# Patient Record
Sex: Male | Born: 1945
Health system: Southern US, Community
[De-identification: ages and names within clinical notes are randomized; demographics above are authoritative.]

## PROBLEM LIST (undated history)

## (undated) DIAGNOSIS — J189 Pneumonia, unspecified organism: Secondary | ICD-10-CM

## (undated) DIAGNOSIS — G4730 Sleep apnea, unspecified: Secondary | ICD-10-CM

## (undated) DIAGNOSIS — E119 Type 2 diabetes mellitus without complications: Secondary | ICD-10-CM

## (undated) HISTORY — PX: TONSILLECTOMY: SUR1361

## (undated) HISTORY — PX: COLONOSCOPY: SHX174

## (undated) HISTORY — DX: Sleep apnea, unspecified: G47.30

## (undated) HISTORY — DX: Type 2 diabetes mellitus without complications: E11.9

## (undated) HISTORY — DX: Pneumonia, unspecified organism: J18.9

---

## 2006-07-01 ENCOUNTER — Ambulatory Visit (HOSPITAL_COMMUNITY): Admission: RE | Admit: 2006-07-01 | Discharge: 2006-07-01 | Payer: Self-pay | Admitting: Gastroenterology

## 2009-12-30 ENCOUNTER — Ambulatory Visit (HOSPITAL_BASED_OUTPATIENT_CLINIC_OR_DEPARTMENT_OTHER): Admission: RE | Admit: 2009-12-30 | Discharge: 2009-12-30 | Payer: Self-pay | Admitting: Internal Medicine

## 2010-01-07 ENCOUNTER — Ambulatory Visit: Payer: Self-pay | Admitting: Internal Medicine

## 2010-02-09 ENCOUNTER — Ambulatory Visit (HOSPITAL_BASED_OUTPATIENT_CLINIC_OR_DEPARTMENT_OTHER): Admission: RE | Admit: 2010-02-09 | Discharge: 2010-02-09 | Payer: Self-pay | Admitting: Internal Medicine

## 2010-02-11 ENCOUNTER — Ambulatory Visit: Payer: Self-pay | Admitting: Internal Medicine

## 2010-08-03 ENCOUNTER — Ambulatory Visit: Payer: Self-pay | Admitting: Family Medicine

## 2010-08-03 DIAGNOSIS — E119 Type 2 diabetes mellitus without complications: Secondary | ICD-10-CM

## 2010-08-03 DIAGNOSIS — G4730 Sleep apnea, unspecified: Secondary | ICD-10-CM | POA: Insufficient documentation

## 2010-08-03 DIAGNOSIS — I10 Essential (primary) hypertension: Secondary | ICD-10-CM | POA: Insufficient documentation

## 2010-11-21 NOTE — Assessment & Plan Note (Signed)
Summary: new pt/spouse of hosp emp/Link to Wellness visit/bmc   Vital Signs:  Patient profile:   65 year old male Height:      72 inches Weight:      200.3 pounds BMI:     27.26  Vitals Entered By: Wyona Almas PHD (August 03, 2010 8:58 AM)  History of Present Illness: Assessment:  Spent 60 minutes w/ pt.  Usual eating pattern includes 3 meals & evening snack.  Robert Fletcher eats [employer] 8-9 X wk (including lunch 6 X wk).  Everyday foods/beverages include Atkins shake w/ 2 tbsp flax seed, diet green tea, iced tea w/ Swt 'n Low, raw almonds.  Current exercise routine includes 60 min TM & other cardio ex 3 X wk.  No resistance ex due to shoulder pblms.  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  Can't estimate kcal intake from 24-hr recall b/c pt couldn't remember dinner: B (7 AM)- Atkins choc bar (190 kcal), water; Snk (9 & 11AM)- 12 oz low-Na V-8, diet green tea; L (12:30 PM)- chef salad w/ chs & oil & vin, S&P, no croutons/crackers,  40 oz unswt tea; Snk (4 & 6 PM)- diet lemonade, diet green tea; D (8 PM)- ???; Snk (9 PM)- apple; 8-10 raw almonds, sugar-free almonds.  Med's include Actos (will go to Metformin if BG don't improve), Quinapril, simvastatin, C-PAP machine, 2000 IU vit D, MVM, cinnamon extract, Alpha lipoic acid, fish oil, B12.  Personal goal is to get off all med's.   Usual bkfst is Atkins shake w/ 2 tbsp flax seed.  Robert Fletcher seldom checks BG, but does occasionally pre-lunch (107-124) and 2-hr pp (109-118).  Last A1c was  ~a month ago:  5.9.  Robert Fletcher relates difficulty complying w/ many recommenda's to his work, i.e., mtg w/ clients gets in the way of checking BG & requires lunchs out; long work hours leave litle time for exercise.  Although Robert Fletcher is on Simvastatin, he says it is not for hyperlipidemia, but for his family hx of CVD, and the quinapril he said is for pre-HTN.  Vit D never tested, but physician recommended supplementation.   Nutrition Diagnosis:   Physical inactivity (NB-2.1) related to time constraints as evidenced by exercise 3 X wk.  Excessive mineral intake (sodium) (NI-55.2) related to restauarnt foods as evidenced by 8-9 rest meals/wk.   Intervention: See Patient Instructions.    Monitoring/Eval:  Per pt; provided my phone # & email for Qs.      Other Orders: Inital Assessment Each - [facility] [phone])  Patient Instructions: 1)  No more than 5 hours between eating.  Afternoon snack:  String cheese, yogurt, fresh fruit, protein bar.   2)  Emphasis on olive oil and canola oil, including olive oil-based mayo, & fish (at least once a week),  3)  Obtain twice as many veg's as protein or carbohydrate foods for both lunch and dinner.   4)  Call or email Dr. Gerilyn Pilgrim with your lipid profile.  Include any info on particle size.  Also let me know info re. cinnamon supplement.   5)  Red flags for poor food decisions:  Hungry, Angry, Anxious, Lonely, Tired (HAALT).   6)  Look at fat and calories in restaurant foods, & try to choose accordingly.    7)  Consider how you can incorporate more activity to your day.   8)  Keep me posted on your progress: [phone] or [email or web address].

## 2011-03-09 NOTE — Op Note (Signed)
NAMEKLEBER, Robert Fletcher                   ACCOUNT NO.:  [id number]   MEDICAL RECORD NO.:  [id number]          PATIENT TYPE:  AMB   LOCATION:  ENDO                         FACILITY:  [facility]   PHYSICIAN:  Bernette Redbird, M.D.   DATE OF BIRTH:  14-Oct-1946   DATE OF PROCEDURE:  07/01/2006  DATE OF DISCHARGE:                                 OPERATIVE REPORT   PROCEDURE:  Colonoscopy.   INDICATION:  65 year old patient with no worrisome risk factors or symptoms  pertaining to the lower tract, for initial colon cancer screening  colonoscopy.   FINDINGS:  Normal exam to the terminal ileum.   PROCEDURE:  The nature, purpose and risks of the procedure have been  discussed with the patient through our open access program through the  office.  I reviewed the purpose and rationale and risks of the procedure  with the patient immediately prior to the exam.  He had provided written  consent.   SEDATION:  Fentanyl 75 mcg and Versed 6 mg IV without arrhythmias or  desaturation.   Digital exam of the prostate was normal.  The Olympus adjustable tension  pediatric video colonoscope was readily advanced to the terminal ileum which  had normal appearance and pullback was then performed in a gradual fashion  around the colon.  The quality of prep was excellent and it is felt that all  areas were well seen.   This was a completely normal examination.  No polyps, cancer, colitis,  vascular malformations or diverticulosis were noted and retroflexion of the  rectum was normal.  No biopsies were obtained.  The patient tolerated the  procedure well and there no apparent complications.   IMPRESSION:  Normal screening colonoscopy in a standard risk individual.   RECOMMENDATIONS:  Repeat colonoscopy in 10 years per current recommendations  of the American cancer Society.           ______________________________  Bernette Redbird, M.D.     RB/MEDQ  D:  07/01/2006  T:  07/01/2006  Job:  [id number]   cc:    Al Decant. Janey Greaser, MD

## 2015-12-06 DIAGNOSIS — Z7984 Long term (current) use of oral hypoglycemic drugs: Secondary | ICD-10-CM | POA: Diagnosis not present

## 2015-12-06 DIAGNOSIS — R972 Elevated prostate specific antigen [PSA]: Secondary | ICD-10-CM | POA: Diagnosis not present

## 2015-12-06 DIAGNOSIS — E119 Type 2 diabetes mellitus without complications: Secondary | ICD-10-CM | POA: Diagnosis not present

## 2015-12-07 MED FILL — TRUEplus LANCETS 30G MISC: 90 days supply | Qty: 100 | Fill #0

## 2015-12-07 MED FILL — metFORMIN HCL 500 MG TABS: 500 | 90 days supply | Qty: 180 | Fill #2

## 2015-12-07 MED FILL — TRUE METRIX GLUCOSE TEST ST: 90 days supply | Qty: 100 | Fill #0

## 2015-12-12 DIAGNOSIS — H2513 Age-related nuclear cataract, bilateral: Secondary | ICD-10-CM | POA: Diagnosis not present

## 2015-12-12 DIAGNOSIS — H43813 Vitreous degeneration, bilateral: Secondary | ICD-10-CM | POA: Diagnosis not present

## 2015-12-12 DIAGNOSIS — E119 Type 2 diabetes mellitus without complications: Secondary | ICD-10-CM | POA: Diagnosis not present

## 2016-01-04 MED FILL — TAMSULOSIN HCL 0.4 MG CAP: 0.4 | 30 days supply | Qty: 30 | Fill #1

## 2016-01-05 MED FILL — CIALIS 20 MG TABLET: 20 | 30 days supply | Qty: 6 | Fill #1

## 2016-01-21 DIAGNOSIS — J111 Influenza due to unidentified influenza virus with other respiratory manifestations: Secondary | ICD-10-CM | POA: Diagnosis not present

## 2016-01-24 DIAGNOSIS — J111 Influenza due to unidentified influenza virus with other respiratory manifestations: Secondary | ICD-10-CM | POA: Diagnosis not present

## 2016-03-01 MED FILL — TAMSULOSIN HCL 0.4 MG CAP: 0.4 | 30 days supply | Qty: 30 | Fill #2

## 2016-03-22 MED FILL — metFORMIN HCL 500 MG TABS: 500 | 90 days supply | Qty: 180 | Fill #3

## 2016-03-26 MED FILL — CIALIS 20 MG TABLET: 20 | 30 days supply | Qty: 6 | Fill #2

## 2016-04-10 MED FILL — TAMSULOSIN HCL 0.4 MG CAP: 0.4 | 30 days supply | Qty: 30 | Fill #3

## 2016-05-17 MED FILL — TAMSULOSIN HCL 0.4 MG CAP: 0.4 | 30 days supply | Qty: 30 | Fill #4

## 2016-06-07 DIAGNOSIS — E119 Type 2 diabetes mellitus without complications: Secondary | ICD-10-CM | POA: Diagnosis not present

## 2016-06-07 DIAGNOSIS — Z7984 Long term (current) use of oral hypoglycemic drugs: Secondary | ICD-10-CM | POA: Diagnosis not present

## 2016-06-11 MED FILL — CIALIS 20 MG TABLET: 20 | 30 days supply | Qty: 6 | Fill #3

## 2016-06-26 MED FILL — TAMSULOSIN HCL 0.4 MG CAP: 0.4 | 30 days supply | Qty: 30 | Fill #5

## 2016-07-19 DIAGNOSIS — Z23 Encounter for immunization: Secondary | ICD-10-CM | POA: Diagnosis not present

## 2016-07-27 MED FILL — TAMSULOSIN HCL 0.4 MG CAP: 0.4 | 90 days supply | Qty: 90 | Fill #6

## 2016-07-27 MED FILL — CIALIS 20 MG TABLET: 20 | 30 days supply | Qty: 6 | Fill #4

## 2016-09-04 MED FILL — metFORMIN HCL 500 MG TABS: 500 | 90 days supply | Qty: 90 | Fill #0

## 2016-11-22 MED FILL — SILDENAFIL 100 MG TABLET: 100 | 30 days supply | Qty: 6 | Fill #0

## 2016-11-22 MED FILL — metFORMIN HCL 500 MG TABS: 500 | 90 days supply | Qty: 90 | Fill #1

## 2016-11-22 MED FILL — TAMSULOSIN HCL 0.4 MG CAP: 0.4 | 30 days supply | Qty: 30 | Fill #0

## 2016-12-11 DIAGNOSIS — N400 Enlarged prostate without lower urinary tract symptoms: Secondary | ICD-10-CM | POA: Diagnosis not present

## 2016-12-11 DIAGNOSIS — E119 Type 2 diabetes mellitus without complications: Secondary | ICD-10-CM | POA: Diagnosis not present

## 2016-12-12 DIAGNOSIS — H5203 Hypermetropia, bilateral: Secondary | ICD-10-CM | POA: Diagnosis not present

## 2016-12-12 DIAGNOSIS — H524 Presbyopia: Secondary | ICD-10-CM | POA: Diagnosis not present

## 2017-01-03 MED FILL — TAMSULOSIN HCL 0.4 MG CAP: 0.4 | 90 days supply | Qty: 90 | Fill #1

## 2017-02-19 MED FILL — metFORMIN HCL 500 MG TABS: 500 | 90 days supply | Qty: 90 | Fill #2

## 2017-04-30 MED FILL — TAMSULOSIN HCL 0.4 MG CAP: 0.4 | 90 days supply | Qty: 90 | Fill #2

## 2017-05-14 MED FILL — SILDENAFIL 100 MG TABLET: 100 | 30 days supply | Qty: 6 | Fill #1

## 2017-06-10 DIAGNOSIS — E1165 Type 2 diabetes mellitus with hyperglycemia: Secondary | ICD-10-CM | POA: Diagnosis not present

## 2017-06-10 DIAGNOSIS — E119 Type 2 diabetes mellitus without complications: Secondary | ICD-10-CM | POA: Diagnosis not present

## 2017-06-10 DIAGNOSIS — N400 Enlarged prostate without lower urinary tract symptoms: Secondary | ICD-10-CM | POA: Diagnosis not present

## 2017-06-10 DIAGNOSIS — Z125 Encounter for screening for malignant neoplasm of prostate: Secondary | ICD-10-CM | POA: Diagnosis not present

## 2017-06-10 DIAGNOSIS — N529 Male erectile dysfunction, unspecified: Secondary | ICD-10-CM | POA: Diagnosis not present

## 2017-06-10 DIAGNOSIS — Z0000 Encounter for general adult medical examination without abnormal findings: Secondary | ICD-10-CM | POA: Diagnosis not present

## 2017-06-10 DIAGNOSIS — G4733 Obstructive sleep apnea (adult) (pediatric): Secondary | ICD-10-CM | POA: Diagnosis not present

## 2017-06-10 DIAGNOSIS — N183 Chronic kidney disease, stage 3 (moderate): Secondary | ICD-10-CM | POA: Diagnosis not present

## 2017-06-10 DIAGNOSIS — Z7984 Long term (current) use of oral hypoglycemic drugs: Secondary | ICD-10-CM | POA: Diagnosis not present

## 2017-06-19 MED FILL — metFORMIN HCL 500 MG TABS: 500 | 90 days supply | Qty: 90 | Fill #3

## 2017-07-04 DIAGNOSIS — M9902 Segmental and somatic dysfunction of thoracic region: Secondary | ICD-10-CM | POA: Diagnosis not present

## 2017-07-04 DIAGNOSIS — M5431 Sciatica, right side: Secondary | ICD-10-CM | POA: Diagnosis not present

## 2017-07-04 DIAGNOSIS — M9903 Segmental and somatic dysfunction of lumbar region: Secondary | ICD-10-CM | POA: Diagnosis not present

## 2017-07-04 DIAGNOSIS — M9905 Segmental and somatic dysfunction of pelvic region: Secondary | ICD-10-CM | POA: Diagnosis not present

## 2017-07-11 DIAGNOSIS — M5431 Sciatica, right side: Secondary | ICD-10-CM | POA: Diagnosis not present

## 2017-07-11 DIAGNOSIS — M9903 Segmental and somatic dysfunction of lumbar region: Secondary | ICD-10-CM | POA: Diagnosis not present

## 2017-07-11 DIAGNOSIS — M9905 Segmental and somatic dysfunction of pelvic region: Secondary | ICD-10-CM | POA: Diagnosis not present

## 2017-07-11 DIAGNOSIS — M9902 Segmental and somatic dysfunction of thoracic region: Secondary | ICD-10-CM | POA: Diagnosis not present

## 2017-07-19 DIAGNOSIS — M9902 Segmental and somatic dysfunction of thoracic region: Secondary | ICD-10-CM | POA: Diagnosis not present

## 2017-07-19 DIAGNOSIS — M9903 Segmental and somatic dysfunction of lumbar region: Secondary | ICD-10-CM | POA: Diagnosis not present

## 2017-07-19 DIAGNOSIS — M9905 Segmental and somatic dysfunction of pelvic region: Secondary | ICD-10-CM | POA: Diagnosis not present

## 2017-07-19 DIAGNOSIS — M5431 Sciatica, right side: Secondary | ICD-10-CM | POA: Diagnosis not present

## 2017-07-24 ENCOUNTER — Encounter: Payer: Self-pay | Admitting: Gastroenterology

## 2017-07-25 DIAGNOSIS — M9905 Segmental and somatic dysfunction of pelvic region: Secondary | ICD-10-CM | POA: Diagnosis not present

## 2017-07-25 DIAGNOSIS — M5431 Sciatica, right side: Secondary | ICD-10-CM | POA: Diagnosis not present

## 2017-07-25 DIAGNOSIS — M9903 Segmental and somatic dysfunction of lumbar region: Secondary | ICD-10-CM | POA: Diagnosis not present

## 2017-07-25 DIAGNOSIS — M9902 Segmental and somatic dysfunction of thoracic region: Secondary | ICD-10-CM | POA: Diagnosis not present

## 2017-08-08 DIAGNOSIS — M9905 Segmental and somatic dysfunction of pelvic region: Secondary | ICD-10-CM | POA: Diagnosis not present

## 2017-08-08 DIAGNOSIS — M5431 Sciatica, right side: Secondary | ICD-10-CM | POA: Diagnosis not present

## 2017-08-08 DIAGNOSIS — M9903 Segmental and somatic dysfunction of lumbar region: Secondary | ICD-10-CM | POA: Diagnosis not present

## 2017-08-08 DIAGNOSIS — M9902 Segmental and somatic dysfunction of thoracic region: Secondary | ICD-10-CM | POA: Diagnosis not present

## 2017-08-12 MED FILL — TAMSULOSIN HCL 0.4 MG CAP: 0.4 | 90 days supply | Qty: 90 | Fill #3

## 2017-09-09 DIAGNOSIS — R5383 Other fatigue: Secondary | ICD-10-CM | POA: Diagnosis not present

## 2017-09-09 DIAGNOSIS — R51 Headache: Secondary | ICD-10-CM | POA: Diagnosis not present

## 2017-09-09 DIAGNOSIS — J069 Acute upper respiratory infection, unspecified: Secondary | ICD-10-CM | POA: Diagnosis not present

## 2017-09-09 DIAGNOSIS — R05 Cough: Secondary | ICD-10-CM | POA: Diagnosis not present

## 2017-09-09 DIAGNOSIS — B9789 Other viral agents as the cause of diseases classified elsewhere: Secondary | ICD-10-CM | POA: Diagnosis not present

## 2017-09-16 ENCOUNTER — Other Ambulatory Visit: Payer: Self-pay | Admitting: Internal Medicine

## 2017-09-16 ENCOUNTER — Ambulatory Visit
Admission: RE | Admit: 2017-09-16 | Discharge: 2017-09-16 | Disposition: A | Discharge status: Home / Self Care | Payer: 59 | Source: Ambulatory Visit | Attending: Internal Medicine | Admitting: Internal Medicine

## 2017-09-16 DIAGNOSIS — R059 Cough, unspecified: Secondary | ICD-10-CM

## 2017-09-16 DIAGNOSIS — R05 Cough: Secondary | ICD-10-CM | POA: Diagnosis not present

## 2017-09-16 DIAGNOSIS — R918 Other nonspecific abnormal finding of lung field: Secondary | ICD-10-CM | POA: Diagnosis not present

## 2017-09-16 MED FILL — AZITHROMYCIN 250 MG TABLET: 250 | 5 days supply | Qty: 6 | Fill #0

## 2017-09-20 MED FILL — metFORMIN HCL 500 MG TABS: 500 | 90 days supply | Qty: 90 | Fill #0

## 2017-09-23 ENCOUNTER — Other Ambulatory Visit: Payer: Self-pay

## 2017-09-23 ENCOUNTER — Ambulatory Visit (AMBULATORY_SURGERY_CENTER): Payer: Self-pay | Admitting: *Deleted

## 2017-09-23 VITALS — Ht 72.0 in | Wt 192.0 lb

## 2017-09-23 DIAGNOSIS — Z1211 Encounter for screening for malignant neoplasm of colon: Secondary | ICD-10-CM

## 2017-09-23 MED ORDER — NA SULFATE-K SULFATE-MG SULF 17.5-3.13-1.6 GM/177ML PO SOLN: ORAL | 0 refills | Status: DC

## 2017-09-23 MED FILL — SUPREP BOWEL PREP KIT: 17.5-3.13-1 | 1 days supply | Qty: 354 | Fill #0

## 2017-09-23 NOTE — Progress Notes (Signed)
Patient denies any allergies to eggs or soy. Patient denies any problems with anesthesia/sedation. Patient denies any oxygen use at home. Patient denies taking any diet/weight loss medications or blood thinners. EMMI education assisgned to patient on colonoscopy, this was explained and instructions given to patient. Patient was just treated for "walking pneumonia", he is aware to contact his PCP to get repeat chest xray and clearance before screening colonoscopy. Pt will call us back with this information.

## 2017-09-25 ENCOUNTER — Encounter: Payer: Self-pay | Admitting: Gastroenterology

## 2017-09-25 DIAGNOSIS — H2513 Age-related nuclear cataract, bilateral: Secondary | ICD-10-CM | POA: Diagnosis not present

## 2017-09-25 DIAGNOSIS — H2511 Age-related nuclear cataract, right eye: Secondary | ICD-10-CM | POA: Diagnosis not present

## 2017-09-27 ENCOUNTER — Telehealth: Payer: Self-pay | Admitting: *Deleted

## 2017-09-27 NOTE — Telephone Encounter (Signed)
Called patient, no answer, left message. Informed patient to call us back to let us know if he seen PCP and was cleared for procedure after recent pneumonia, before up coming colonoscopy on 10/07/17.

## 2017-10-01 ENCOUNTER — Ambulatory Visit
Admission: RE | Admit: 2017-10-01 | Discharge: 2017-10-01 | Disposition: A | Discharge status: Home / Self Care | Payer: 59 | Source: Ambulatory Visit | Attending: Internal Medicine | Admitting: Internal Medicine

## 2017-10-01 ENCOUNTER — Other Ambulatory Visit: Payer: Self-pay | Admitting: Internal Medicine

## 2017-10-01 DIAGNOSIS — J180 Bronchopneumonia, unspecified organism: Secondary | ICD-10-CM

## 2017-10-01 DIAGNOSIS — R918 Other nonspecific abnormal finding of lung field: Secondary | ICD-10-CM | POA: Diagnosis not present

## 2017-10-04 NOTE — Telephone Encounter (Signed)
Spoke with Dr.John Griffin's nurse, "Alona BeneJoyce" she went and asked Dr.Griffin if patient was cleared for colonoscopy from having the pneumonia and Dr.Griffin said yes the patient is cleared for colonoscopy. I gave Bonita QuinLinda patient's wife this information.

## 2017-10-07 ENCOUNTER — Encounter: Payer: Self-pay | Admitting: Gastroenterology

## 2017-10-07 ENCOUNTER — Other Ambulatory Visit: Payer: Self-pay

## 2017-10-07 ENCOUNTER — Ambulatory Visit (AMBULATORY_SURGERY_CENTER): Payer: 59 | Admitting: Gastroenterology

## 2017-10-07 VITALS — BP 104/50 | HR 56 | Temp 97.3°F | Resp 12 | Ht 72.0 in | Wt 192.0 lb

## 2017-10-07 DIAGNOSIS — Z1212 Encounter for screening for malignant neoplasm of rectum: Secondary | ICD-10-CM

## 2017-10-07 DIAGNOSIS — G4733 Obstructive sleep apnea (adult) (pediatric): Secondary | ICD-10-CM | POA: Diagnosis not present

## 2017-10-07 DIAGNOSIS — Z1211 Encounter for screening for malignant neoplasm of colon: Secondary | ICD-10-CM | POA: Diagnosis present

## 2017-10-07 DIAGNOSIS — E119 Type 2 diabetes mellitus without complications: Secondary | ICD-10-CM | POA: Diagnosis not present

## 2017-10-07 MED ORDER — SODIUM CHLORIDE 0.9 % IV SOLN: 500.0000 mL | Freq: Once | INTRAVENOUS | Status: AC

## 2017-10-07 NOTE — Op Note (Signed)
[facility] Patient Name: Robert KennedyCarl Fletcher Procedure Date: 10/07/2017 9:04 AM MRN: [id number] Endoscopist: Sherilyn CooterHenry L. Myrtie Neitheranis , MD Age: 71 Referring MD:  Date of Birth: 11/14/1945 Gender: Male Account #: [id number] Procedure:                Colonoscopy Indications:              Screening for colorectal malignant neoplasm                            (reportedly normal screening colonoscopy 10 years                            prior) Medicines:                Monitored Anesthesia Care Procedure:                Pre-Anesthesia Assessment:                           - Prior to the procedure, a History and Physical                            was performed, and patient medications and                            allergies were reviewed. The patient's tolerance of                            previous anesthesia was also reviewed. The risks                            and benefits of the procedure and the sedation                            options and risks were discussed with the patient.                            All questions were answered, and informed consent                            was obtained. Prior Anticoagulants: The patient has                            taken no previous anticoagulant or antiplatelet                            agents. ASA Grade Assessment: II - A patient with                            mild systemic disease. After reviewing the risks                            and benefits, the patient was deemed in  satisfactory condition to undergo the procedure.                           After obtaining informed consent, the colonoscope                            was passed under direct vision. Throughout the                            procedure, the patient's blood pressure, pulse, and                            oxygen saturations were monitored continuously. The                            Colonoscope was introduced through the anus and             advanced to the the cecum, identified by                            appendiceal orifice and ileocecal valve. The                            colonoscopy was performed without difficulty. The                            patient tolerated the procedure well. The quality                            of the bowel preparation was excellent. The                            ileocecal valve, appendiceal orifice, and rectum                            were photographed. The quality of the bowel                            preparation was evaluated using the BBPS [facility]                            Bowel Preparation Scale) with scores of: Right                            Colon = 3, Transverse Colon = 3 and Left Colon = 3                            (entire mucosa seen well with no residual staining,                            small fragments of stool or opaque liquid). The                            total BBPS score  equals 9. The bowel preparation                            used was SUPREP. Scope In: 9:08:32 AM Scope Out: 9:21:41 AM Scope Withdrawal Time: 0 hours 9 minutes 42 seconds  Total Procedure Duration: 0 hours 13 minutes 9 seconds  Findings:                 The perianal and digital rectal examinations were                            normal.                           The entire examined colon appeared normal on direct                            and retroflexion views. Complications:            No immediate complications. Estimated Blood Loss:     Estimated blood loss: none. Impression:               - The entire examined colon is normal on direct and                            retroflexion views.                           - No specimens collected. Recommendation:           - Patient has a contact number available for                            emergencies. The signs and symptoms of potential                            delayed complications were discussed with the                             patient. Return to normal activities tomorrow.                            Written discharge instructions were provided to the                            patient.                           - Resume previous diet.                           - Continue present medications.                           - Repeat colonoscopy in 10 years for screening                            purposes. Henry L. Myrtie Neither, MD 10/07/2017 9:26:34 AM  This report has been signed electronically.

## 2017-10-07 NOTE — Progress Notes (Signed)
Pt's states no medical or surgical changes since previsit or office visit. 

## 2017-10-07 NOTE — Patient Instructions (Signed)
YOU HAD AN ENDOSCOPIC PROCEDURE TODAY AT THE [facility]:   Refer to the procedure report that was given to you for any specific questions about what was found during the examination.  If the procedure report does not answer your questions, please call your gastroenterologist to clarify.  If you requested that your care partner not be given the details of your procedure findings, then the procedure report has been included in a sealed envelope for you to review at your convenience later.  YOU SHOULD EXPECT: Some feelings of bloating in the abdomen. Passage of more gas than usual.  Walking can help get rid of the air that was put into your GI tract during the procedure and reduce the bloating. If you had a lower endoscopy (such as a colonoscopy or flexible sigmoidoscopy) you may notice spotting of blood in your stool or on the toilet paper. If you underwent a bowel prep for your procedure, you may not have a normal bowel movement for a few days.  Please Note:  You might notice some irritation and congestion in your nose or some drainage.  This is from the oxygen used during your procedure.  There is no need for concern and it should clear up in a day or so.  SYMPTOMS TO REPORT IMMEDIATELY:   Following lower endoscopy (colonoscopy or flexible sigmoidoscopy):  Excessive amounts of blood in the stool  Significant tenderness or worsening of abdominal pains  Swelling of the abdomen that is new, acute  Fever of 100F or higher  For urgent or emergent issues, a gastroenterologist can be reached at any hour by calling [phone].   DIET:  We do recommend a small meal at first, but then you may proceed to your regular diet.  Drink plenty of fluids but you should avoid alcoholic beverages for 24 hours.  ACTIVITY:  You should plan to take it easy for the rest of today and you should NOT DRIVE or use heavy machinery until tomorrow (because of the sedation medicines used during the test).     FOLLOW UP: Our staff will call the number listed on your records the next business day following your procedure to check on you and address any questions or concerns that you may have regarding the information given to you following your procedure. If we do not reach you, we will leave a message.  However, if you are feeling well and you are not experiencing any problems, there is no need to return our call.  We will assume that you have returned to your regular daily activities without incident.  If any biopsies were taken you will be contacted by phone or by letter within the next 1-3 weeks.  Please call us at [phone] if you have not heard about the biopsies in 3 weeks.    SIGNATURES/CONFIDENTIALITY: You and/or your care partner have signed paperwork which will be entered into your electronic medical record.  These signatures attest to the fact that that the information above on your After Visit Summary has been reviewed and is understood.  Full responsibility of the confidentiality of this discharge information lies with you and/or your care-partner.    Thank you for choosing us to take care of your healthcare needs today.

## 2017-10-07 NOTE — Progress Notes (Signed)
Report given to PACU, vss 

## 2017-10-08 ENCOUNTER — Telehealth: Payer: Self-pay | Admitting: *Deleted

## 2017-10-08 NOTE — Telephone Encounter (Signed)
  Follow up Call-  Call back number 10/07/2017  Post procedure Call Back phone  # [phone]  Permission to leave phone message Yes  Some recent data might be hidden     Patient questions:  Do you have a fever, pain , or abdominal swelling? No. Pain Score  0 *  Have you tolerated food without any problems? Yes.    Have you been able to return to your normal activities? Yes.    Do you have any questions about your discharge instructions: Diet   No. Medications  No. Follow up visit  No.  Do you have questions or concerns about your Care? No.  Actions: * If pain score is 4 or above: No action needed, pain <4.

## 2017-10-23 MED FILL — BESIVANCE 0.6% SUSP: 0.6 | 30 days supply | Qty: 5 | Fill #0

## 2017-10-30 MED FILL — DUREZOL 0.05% EYE DROPS: 0.05 | 30 days supply | Qty: 5 | Fill #0

## 2017-10-31 MED FILL — PROLENSA 0.07% EYE DROPS: 0.07 | 60 days supply | Qty: 3 | Fill #0

## 2017-11-05 DIAGNOSIS — H2511 Age-related nuclear cataract, right eye: Secondary | ICD-10-CM | POA: Diagnosis not present

## 2017-11-05 DIAGNOSIS — H25811 Combined forms of age-related cataract, right eye: Secondary | ICD-10-CM | POA: Diagnosis not present

## 2017-11-06 DIAGNOSIS — H2512 Age-related nuclear cataract, left eye: Secondary | ICD-10-CM | POA: Diagnosis not present

## 2017-11-19 DIAGNOSIS — H25812 Combined forms of age-related cataract, left eye: Secondary | ICD-10-CM | POA: Diagnosis not present

## 2017-11-19 DIAGNOSIS — H2512 Age-related nuclear cataract, left eye: Secondary | ICD-10-CM | POA: Diagnosis not present

## 2017-11-21 MED FILL — TAMSULOSIN HCL 0.4 MG CAP: 0.4 | 30 days supply | Qty: 30 | Fill #4

## 2017-12-16 DIAGNOSIS — E119 Type 2 diabetes mellitus without complications: Secondary | ICD-10-CM | POA: Diagnosis not present

## 2017-12-16 DIAGNOSIS — N183 Chronic kidney disease, stage 3 (moderate): Secondary | ICD-10-CM | POA: Diagnosis not present

## 2017-12-16 DIAGNOSIS — N400 Enlarged prostate without lower urinary tract symptoms: Secondary | ICD-10-CM | POA: Diagnosis not present

## 2017-12-16 DIAGNOSIS — Z7984 Long term (current) use of oral hypoglycemic drugs: Secondary | ICD-10-CM | POA: Diagnosis not present

## 2017-12-17 MED FILL — metFORMIN HCL 500 MG TABS: 500 | 90 days supply | Qty: 180 | Fill #0

## 2017-12-17 MED FILL — TAMSULOSIN HCL 0.4 MG CAP: 0.4 | 90 days supply | Qty: 90 | Fill #0

## 2018-01-27 DIAGNOSIS — H52203 Unspecified astigmatism, bilateral: Secondary | ICD-10-CM | POA: Diagnosis not present

## 2018-03-10 MED FILL — metFORMIN HCL 500 MG TABS: 500 | 90 days supply | Qty: 180 | Fill #1

## 2018-03-31 MED FILL — SILDENAFIL CITRATE 100 MG T: 100 | 30 days supply | Qty: 6 | Fill #0

## 2018-03-31 MED FILL — TAMSULOSIN HCL 0.4 MG CAP: 0.4 | 90 days supply | Qty: 90 | Fill #1

## 2018-05-15 MED FILL — SHINGRIX 50 MCG SUS: 50 | 1 days supply | Qty: 1 | Fill #0

## 2018-06-17 DIAGNOSIS — N400 Enlarged prostate without lower urinary tract symptoms: Secondary | ICD-10-CM | POA: Diagnosis not present

## 2018-06-17 DIAGNOSIS — Z7984 Long term (current) use of oral hypoglycemic drugs: Secondary | ICD-10-CM | POA: Diagnosis not present

## 2018-06-17 DIAGNOSIS — G4733 Obstructive sleep apnea (adult) (pediatric): Secondary | ICD-10-CM | POA: Diagnosis not present

## 2018-06-17 DIAGNOSIS — N183 Chronic kidney disease, stage 3 (moderate): Secondary | ICD-10-CM | POA: Diagnosis not present

## 2018-06-17 DIAGNOSIS — E1165 Type 2 diabetes mellitus with hyperglycemia: Secondary | ICD-10-CM | POA: Diagnosis not present

## 2018-06-17 DIAGNOSIS — E119 Type 2 diabetes mellitus without complications: Secondary | ICD-10-CM | POA: Diagnosis not present

## 2018-06-17 DIAGNOSIS — N529 Male erectile dysfunction, unspecified: Secondary | ICD-10-CM | POA: Diagnosis not present

## 2018-06-24 MED FILL — TAMSULOSIN HCL 0.4 MG CAP: 0.4 | 90 days supply | Qty: 90 | Fill #2

## 2018-06-24 MED FILL — metFORMIN HCL 500 MG TABS: 500 | 90 days supply | Qty: 180 | Fill #2

## 2018-09-01 MED FILL — SHINGRIX 50 MCG SUS: 50 | 1 days supply | Qty: 1 | Fill #1

## 2018-09-22 DIAGNOSIS — G4733 Obstructive sleep apnea (adult) (pediatric): Secondary | ICD-10-CM | POA: Diagnosis not present

## 2018-09-22 DIAGNOSIS — M9903 Segmental and somatic dysfunction of lumbar region: Secondary | ICD-10-CM | POA: Diagnosis not present

## 2018-09-22 DIAGNOSIS — M9905 Segmental and somatic dysfunction of pelvic region: Secondary | ICD-10-CM | POA: Diagnosis not present

## 2018-09-22 DIAGNOSIS — M5431 Sciatica, right side: Secondary | ICD-10-CM | POA: Diagnosis not present

## 2018-09-22 DIAGNOSIS — M9902 Segmental and somatic dysfunction of thoracic region: Secondary | ICD-10-CM | POA: Diagnosis not present

## 2018-09-26 MED FILL — TAMSULOSIN HCL 0.4 MG CAP: 0.4 | 90 days supply | Qty: 90 | Fill #3

## 2018-09-26 MED FILL — metFORMIN HCL 500 MG TABS: 500 | 90 days supply | Qty: 180 | Fill #3

## 2018-10-27 DIAGNOSIS — M9903 Segmental and somatic dysfunction of lumbar region: Secondary | ICD-10-CM | POA: Diagnosis not present

## 2018-10-27 DIAGNOSIS — M9902 Segmental and somatic dysfunction of thoracic region: Secondary | ICD-10-CM | POA: Diagnosis not present

## 2018-10-27 DIAGNOSIS — M9905 Segmental and somatic dysfunction of pelvic region: Secondary | ICD-10-CM | POA: Diagnosis not present

## 2018-10-27 DIAGNOSIS — M5431 Sciatica, right side: Secondary | ICD-10-CM | POA: Diagnosis not present

## 2018-10-28 MED FILL — SILDENAFIL CITRATE 100 MG T: 100 | 30 days supply | Qty: 6 | Fill #1

## 2018-12-01 DIAGNOSIS — M9903 Segmental and somatic dysfunction of lumbar region: Secondary | ICD-10-CM | POA: Diagnosis not present

## 2018-12-01 DIAGNOSIS — M9902 Segmental and somatic dysfunction of thoracic region: Secondary | ICD-10-CM | POA: Diagnosis not present

## 2018-12-01 DIAGNOSIS — M9905 Segmental and somatic dysfunction of pelvic region: Secondary | ICD-10-CM | POA: Diagnosis not present

## 2018-12-01 DIAGNOSIS — M5431 Sciatica, right side: Secondary | ICD-10-CM | POA: Diagnosis not present

## 2018-12-08 DIAGNOSIS — G4733 Obstructive sleep apnea (adult) (pediatric): Secondary | ICD-10-CM | POA: Diagnosis not present

## 2018-12-16 DIAGNOSIS — N400 Enlarged prostate without lower urinary tract symptoms: Secondary | ICD-10-CM | POA: Diagnosis not present

## 2018-12-16 DIAGNOSIS — E119 Type 2 diabetes mellitus without complications: Secondary | ICD-10-CM | POA: Diagnosis not present

## 2018-12-16 DIAGNOSIS — G4733 Obstructive sleep apnea (adult) (pediatric): Secondary | ICD-10-CM | POA: Diagnosis not present

## 2018-12-16 DIAGNOSIS — N183 Chronic kidney disease, stage 3 (moderate): Secondary | ICD-10-CM | POA: Diagnosis not present

## 2018-12-19 MED FILL — TAMSULOSIN HCL 0.4 MG CAP: 0.4 | 90 days supply | Qty: 90 | Fill #0

## 2018-12-19 MED FILL — metFORMIN HCL 500 MG TABS: 500 | 90 days supply | Qty: 180 | Fill #0

## 2019-01-03 MED FILL — IPRATROPIUM 0.06% SPRAY: 0.06 | 30 days supply | Qty: 15 | Fill #0

## 2019-02-21 IMAGING — DX DG CHEST 2V
2 series · 2 of 2 positions shown · non-contrast
Comparison: None.

CLINICAL DATA: Productive cough for 2 weeks.

EXAM:
CHEST  2 VIEW

[dg chest 2 view (1 of 2)]
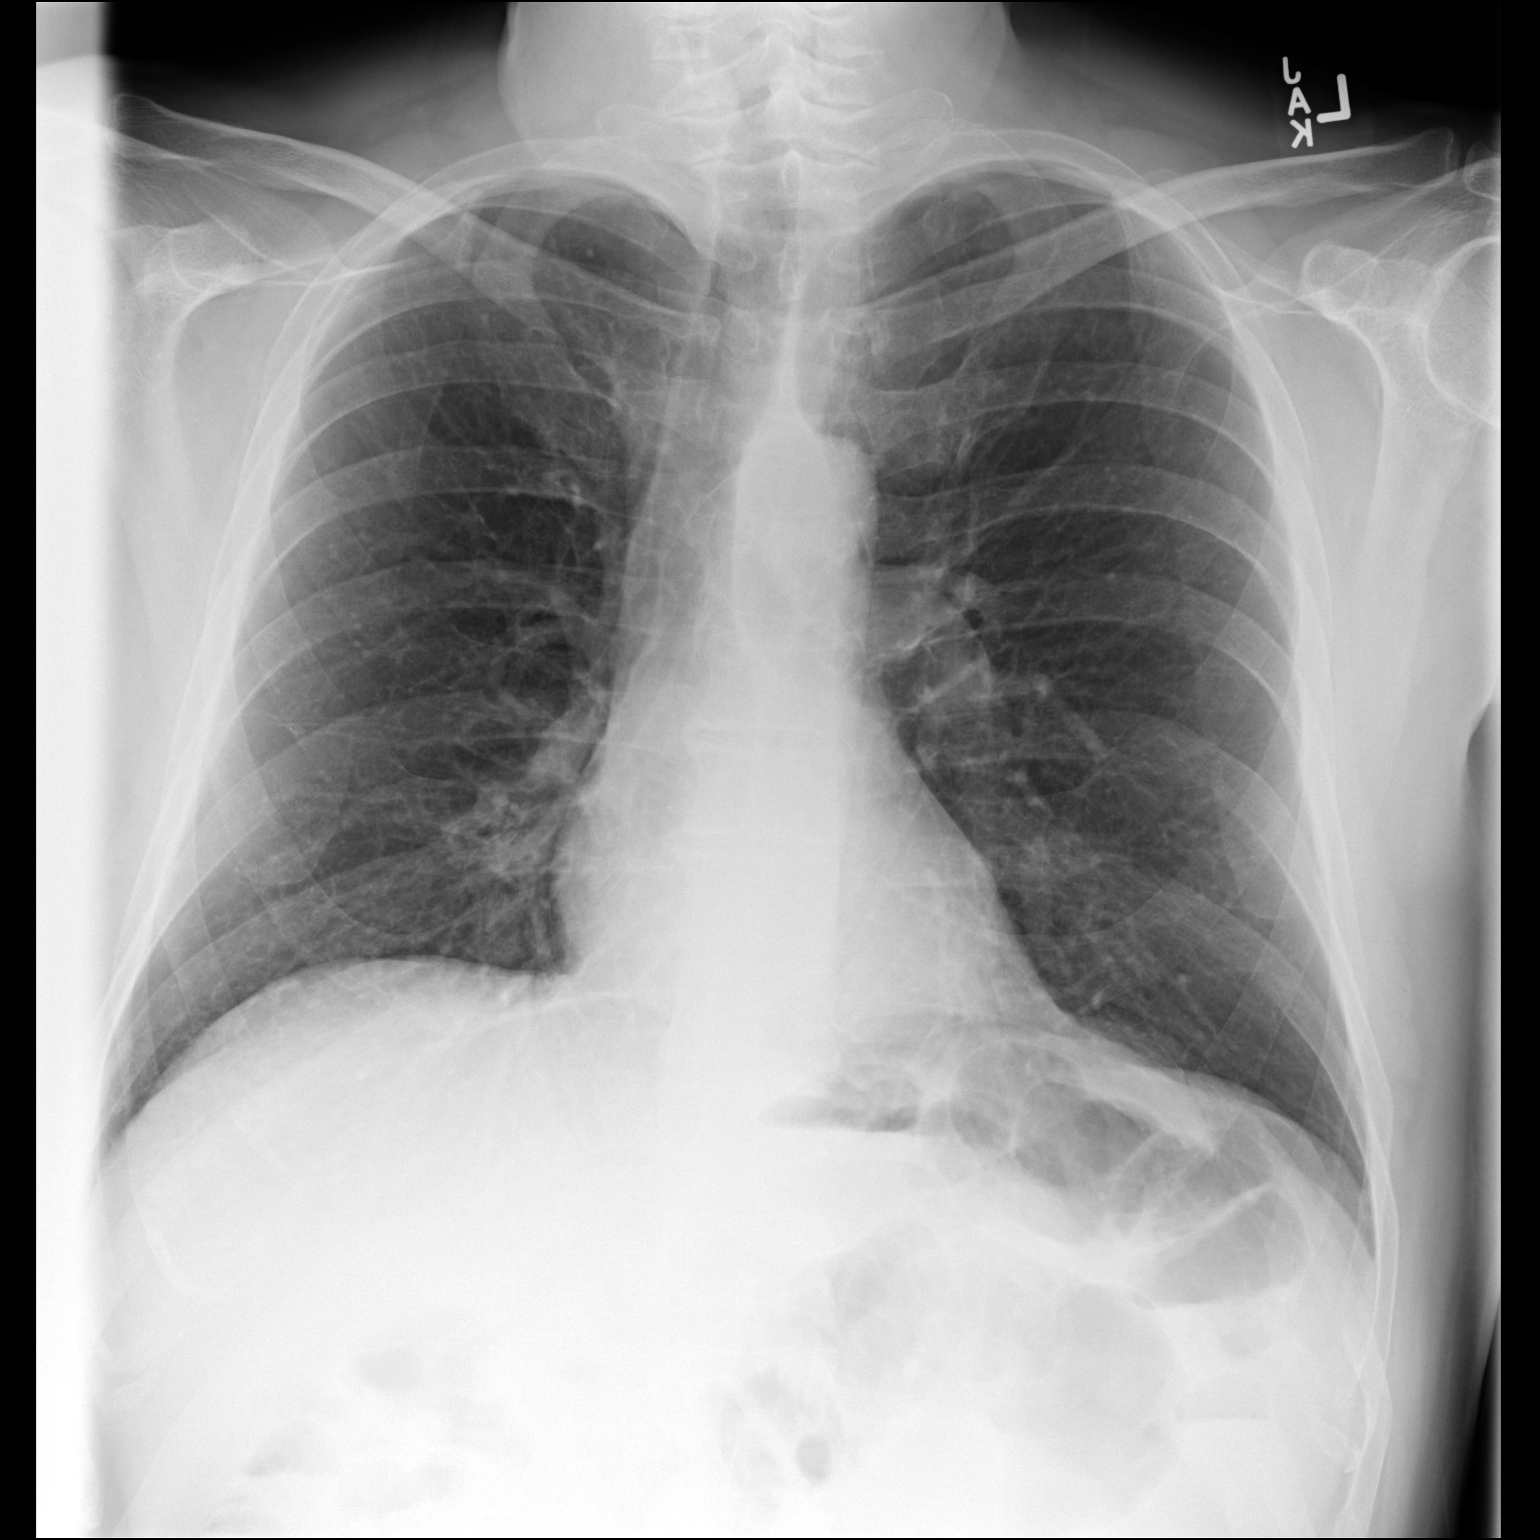

[dg chest 2 view (2 of 2)]
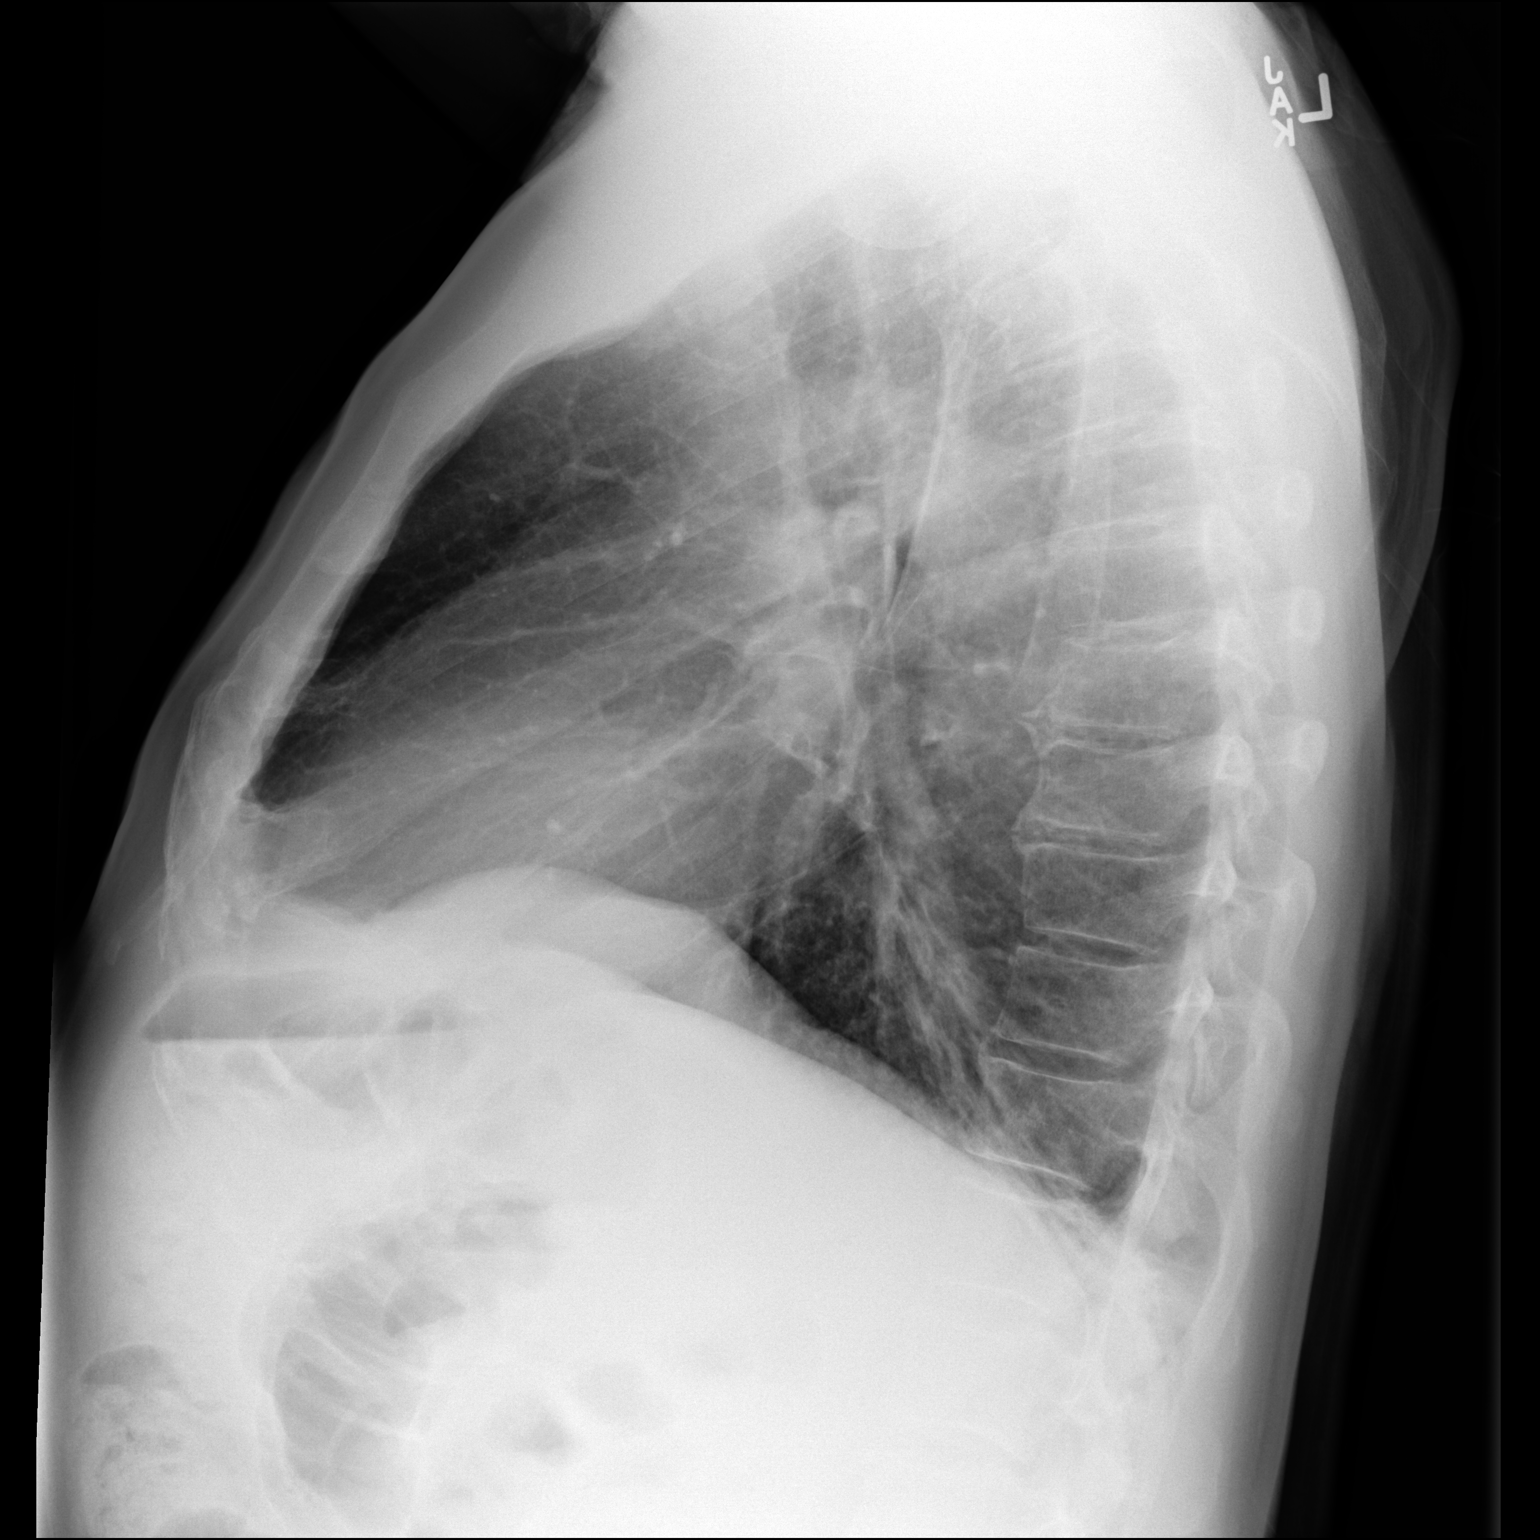

[2 of 2 positions shown; findings below may reference images not displayed]

FINDINGS: Streaky opacity in 1 of the lower lobes in the lateral projection,
favored left. No edema, effusion, or pneumothorax. Normal heart size
and mediastinal contours.
IMPRESSION: Lower lobe atelectasis/bronchopneumonia seen in the lateral
projection. Followup PA and lateral chest X-ray is recommended in
3-4 weeks following trial of antibiotic therapy to ensure
resolution.

## 2019-02-23 DIAGNOSIS — H52203 Unspecified astigmatism, bilateral: Secondary | ICD-10-CM | POA: Diagnosis not present

## 2019-03-23 MED FILL — TAMSULOSIN HCL 0.4 MG CAP: 0.4 | 90 days supply | Qty: 90 | Fill #1

## 2019-03-24 MED FILL — metFORMIN HCL ER 500 MG TB2: 500 | 30 days supply | Qty: 30 | Fill #0

## 2019-04-03 MED FILL — metFORMIN HCL ER 500 MG TB2: 500 | 90 days supply | Qty: 270 | Fill #0

## 2019-04-23 DIAGNOSIS — G4710 Hypersomnia, unspecified: Secondary | ICD-10-CM | POA: Diagnosis not present

## 2019-04-23 DIAGNOSIS — G4733 Obstructive sleep apnea (adult) (pediatric): Secondary | ICD-10-CM | POA: Diagnosis not present

## 2019-05-14 DIAGNOSIS — G4733 Obstructive sleep apnea (adult) (pediatric): Secondary | ICD-10-CM | POA: Diagnosis not present

## 2019-05-14 DIAGNOSIS — G4710 Hypersomnia, unspecified: Secondary | ICD-10-CM | POA: Diagnosis not present

## 2019-05-24 DIAGNOSIS — G4733 Obstructive sleep apnea (adult) (pediatric): Secondary | ICD-10-CM | POA: Diagnosis not present

## 2019-05-24 DIAGNOSIS — G4710 Hypersomnia, unspecified: Secondary | ICD-10-CM | POA: Diagnosis not present

## 2019-05-25 MED FILL — SILDENAFIL CITRATE 100 MG T: 100 | 30 days supply | Qty: 6 | Fill #0

## 2019-06-24 DIAGNOSIS — N400 Enlarged prostate without lower urinary tract symptoms: Secondary | ICD-10-CM | POA: Diagnosis not present

## 2019-06-24 DIAGNOSIS — N183 Chronic kidney disease, stage 3 (moderate): Secondary | ICD-10-CM | POA: Diagnosis not present

## 2019-06-24 DIAGNOSIS — E1122 Type 2 diabetes mellitus with diabetic chronic kidney disease: Secondary | ICD-10-CM | POA: Diagnosis not present

## 2019-06-24 DIAGNOSIS — G4710 Hypersomnia, unspecified: Secondary | ICD-10-CM | POA: Diagnosis not present

## 2019-06-24 DIAGNOSIS — G4733 Obstructive sleep apnea (adult) (pediatric): Secondary | ICD-10-CM | POA: Diagnosis not present

## 2019-06-24 DIAGNOSIS — N529 Male erectile dysfunction, unspecified: Secondary | ICD-10-CM | POA: Diagnosis not present

## 2019-06-24 DIAGNOSIS — Z0000 Encounter for general adult medical examination without abnormal findings: Secondary | ICD-10-CM | POA: Diagnosis not present

## 2019-06-24 DIAGNOSIS — Z125 Encounter for screening for malignant neoplasm of prostate: Secondary | ICD-10-CM | POA: Diagnosis not present

## 2019-06-25 MED FILL — METFORMIN HCL ER 500 MG TAB: 500 | 90 days supply | Qty: 360 | Fill #0

## 2019-06-30 MED FILL — TAMSULOSIN HCL 0.4 MG CAP: 0.4 | 90 days supply | Qty: 90 | Fill #2

## 2019-07-24 DIAGNOSIS — G4733 Obstructive sleep apnea (adult) (pediatric): Secondary | ICD-10-CM | POA: Diagnosis not present

## 2019-07-24 DIAGNOSIS — G4710 Hypersomnia, unspecified: Secondary | ICD-10-CM | POA: Diagnosis not present

## 2019-07-28 DIAGNOSIS — G4710 Hypersomnia, unspecified: Secondary | ICD-10-CM | POA: Diagnosis not present

## 2019-07-28 DIAGNOSIS — G4733 Obstructive sleep apnea (adult) (pediatric): Secondary | ICD-10-CM | POA: Diagnosis not present

## 2019-07-28 DIAGNOSIS — L570 Actinic keratosis: Secondary | ICD-10-CM | POA: Diagnosis not present

## 2019-08-21 DIAGNOSIS — G4710 Hypersomnia, unspecified: Secondary | ICD-10-CM | POA: Diagnosis not present

## 2019-08-21 DIAGNOSIS — G4733 Obstructive sleep apnea (adult) (pediatric): Secondary | ICD-10-CM | POA: Diagnosis not present

## 2019-09-14 MED FILL — IPRATROPIUM 0.06% SPRAY: 0.06 | 90 days supply | Qty: 45 | Fill #0

## 2019-09-24 DIAGNOSIS — E1122 Type 2 diabetes mellitus with diabetic chronic kidney disease: Secondary | ICD-10-CM | POA: Diagnosis not present

## 2019-09-28 MED FILL — TAMSULOSIN HCL 0.4 MG CAP: 0.4 | 90 days supply | Qty: 90 | Fill #3

## 2019-09-28 MED FILL — METFORMIN HCL ER 500 MG TB2: 500 | 90 days supply | Qty: 360 | Fill #1

## 2019-10-14 MED FILL — SILDENAFIL CITRATE 100 MG T: 100 | 30 days supply | Qty: 6 | Fill #1

## 2019-12-02 MED FILL — IPRATROPIUM 0.06% SPRAY: 0.06 | 41 days supply | Qty: 45 | Fill #1

## 2019-12-28 MED FILL — TRULICITY 0.75 MG/0.5 ML PE: 0.75 | 28 days supply | Qty: 2 | Fill #0

## 2020-01-12 MED FILL — TAMSULOSIN HCL 0.4 MG CAP: 0.4 | 90 days supply | Qty: 90 | Fill #0

## 2020-01-12 MED FILL — METFORMIN HCL ER 500 MG TB2: 500 | 90 days supply | Qty: 360 | Fill #2

## 2020-01-27 MED FILL — TRULICITY 0.75 MG/0.5 ML PE: 0.75 | 28 days supply | Qty: 2 | Fill #0

## 2020-04-05 ENCOUNTER — Other Ambulatory Visit (HOSPITAL_COMMUNITY): Payer: Self-pay | Admitting: Internal Medicine

## 2020-04-05 MED FILL — IPRATROPIUM 0.06% SPRAY: 0.06 | 41 days supply | Qty: 45 | Fill #0

## 2020-04-05 MED FILL — TAMSULOSIN HCL 0.4 MG CAP: 0.4 | 90 days supply | Qty: 90 | Fill #1

## 2020-04-05 MED FILL — TRULICITY 0.75 MG/0.5 ML PE: 0.75 | 28 days supply | Qty: 2 | Fill #2

## 2020-04-05 MED FILL — METFORMIN HCL ER 500 MG TB2: 500 | 90 days supply | Qty: 360 | Fill #3

## 2020-05-09 MED FILL — TRULICITY 0.75 MG/0.5 ML PE: 0.75 | 28 days supply | Qty: 2 | Fill #3

## 2020-06-10 MED FILL — TRULICITY 0.75 MG/0.5 ML PE: 0.75 | 28 days supply | Qty: 2 | Fill #4

## 2020-06-28 MED FILL — PANTOPRAZOLE SOD DR 40 MG T: 40 | 30 days supply | Qty: 30 | Fill #0

## 2020-06-29 ENCOUNTER — Other Ambulatory Visit: Payer: Self-pay | Admitting: Internal Medicine

## 2020-06-29 DIAGNOSIS — N289 Disorder of kidney and ureter, unspecified: Secondary | ICD-10-CM

## 2020-07-01 ENCOUNTER — Ambulatory Visit (HOSPITAL_COMMUNITY)
Admission: RE | Admit: 2020-07-01 | Discharge: 2020-07-01 | Disposition: A | Discharge status: Home / Self Care | Payer: No Typology Code available for payment source | Source: Ambulatory Visit | Attending: Internal Medicine | Admitting: Internal Medicine

## 2020-07-01 ENCOUNTER — Other Ambulatory Visit (HOSPITAL_COMMUNITY): Payer: Self-pay | Admitting: Internal Medicine

## 2020-07-01 DIAGNOSIS — N289 Disorder of kidney and ureter, unspecified: Secondary | ICD-10-CM | POA: Diagnosis not present

## 2020-07-04 ENCOUNTER — Other Ambulatory Visit: Payer: 59

## 2020-07-11 MED FILL — TAMSULOSIN HCL 0.4 MG CAP: 0.4 | 90 days supply | Qty: 90 | Fill #2

## 2020-07-13 MED FILL — TRULICITY 1.5 MG/0.5 ML PEN: 1.5 | 28 days supply | Qty: 2 | Fill #0

## 2020-08-03 ENCOUNTER — Other Ambulatory Visit (HOSPITAL_COMMUNITY): Payer: Self-pay | Admitting: Internal Medicine

## 2020-08-03 MED FILL — FLUAD QUADRIVALENT 0.5 ML P: 0.5 | 1 days supply | Qty: 1 | Fill #0

## 2020-08-08 MED FILL — IPRATROPIUM 0.06% SPRAY: 0.06 | 41 days supply | Qty: 45 | Fill #1

## 2020-08-15 ENCOUNTER — Other Ambulatory Visit (HOSPITAL_COMMUNITY): Payer: Self-pay | Admitting: Internal Medicine

## 2020-08-15 MED FILL — TRULICITY 1.5 MG/0.5 ML PEN: 1.5 | 28 days supply | Qty: 2 | Fill #1

## 2020-08-15 MED FILL — METFORMIN HCL ER 500 MG TB2: 500 | 90 days supply | Qty: 360 | Fill #0

## 2020-08-15 MED FILL — SILDENAFIL CITRATE 100 MG T: 100 | 30 days supply | Qty: 6 | Fill #0

## 2020-08-19 ENCOUNTER — Other Ambulatory Visit (HOSPITAL_COMMUNITY): Payer: Self-pay | Admitting: Urology

## 2020-08-29 MED FILL — FINASTERIDE 5 MG TABLET: 5 | 90 days supply | Qty: 90 | Fill #0

## 2020-09-26 MED FILL — TRULICITY 1.5 MG/0.5 ML PEN: 1.5 | 28 days supply | Qty: 2 | Fill #2

## 2020-10-17 MED FILL — IPRATROPIUM 0.06% SPRAY: 0.06 | 60 days supply | Qty: 45 | Fill #2

## 2020-10-19 MED FILL — TRULICITY 1.5 MG/0.5 ML PEN: 1.5 | 28 days supply | Qty: 2 | Fill #3

## 2020-11-21 MED FILL — TRULICITY 1.5 MG/0.5 ML PEN: 1.5 | 28 days supply | Qty: 2 | Fill #4

## 2020-11-25 MED FILL — SILDENAFIL CITRATE 100 MG T: 100 | 30 days supply | Qty: 6 | Fill #1

## 2020-11-25 MED FILL — FINASTERIDE 5 MG TABLET: 5 | 90 days supply | Qty: 90 | Fill #1

## 2020-12-19 MED FILL — TRULICITY 1.5 MG/0.5 ML PEN: 1.5 | 28 days supply | Qty: 2 | Fill #5

## 2021-01-06 ENCOUNTER — Other Ambulatory Visit (HOSPITAL_COMMUNITY): Payer: Self-pay | Admitting: Internal Medicine

## 2021-01-09 MED FILL — IPRATROPIUM 0.06% SPRAY: 0.06 | 60 days supply | Qty: 45 | Fill #3

## 2021-02-02 ENCOUNTER — Other Ambulatory Visit (HOSPITAL_COMMUNITY): Payer: Self-pay

## 2021-02-02 MED ORDER — AMOXICILLIN 500 MG PO CAPS
ORAL_CAPSULE | Freq: Three times a day (TID) | ORAL | 1 refills | Status: AC
  Filled 2021-02-02: qty 21, 7d supply, fill #0

## 2021-02-19 MED FILL — Metformin HCl Tab ER 24HR 500 MG: ORAL | 90 days supply | Qty: 360 | Fill #0 | Status: AC

## 2021-02-20 ENCOUNTER — Other Ambulatory Visit (HOSPITAL_COMMUNITY): Payer: Self-pay

## 2021-02-22 ENCOUNTER — Other Ambulatory Visit (HOSPITAL_COMMUNITY): Payer: Self-pay

## 2021-02-22 MED FILL — Sildenafil Citrate Tab 100 MG: ORAL | 30 days supply | Qty: 6 | Fill #0 | Status: AC

## 2021-02-23 ENCOUNTER — Other Ambulatory Visit (HOSPITAL_COMMUNITY): Payer: Self-pay

## 2021-02-23 MED FILL — Finasteride Tab 5 MG: ORAL | 90 days supply | Qty: 90 | Fill #0 | Status: AC

## 2021-02-28 ENCOUNTER — Other Ambulatory Visit (HOSPITAL_COMMUNITY): Payer: Self-pay

## 2021-02-28 MED ORDER — GLIMEPIRIDE 1 MG PO TABS
1.0000 mg | ORAL_TABLET | Freq: Every day | ORAL | 11 refills | Status: DC
  Filled 2021-02-28: qty 30, 30d supply, fill #0
  Filled 2021-03-24: qty 30, 30d supply, fill #1
  Filled 2021-04-20: qty 30, 30d supply, fill #2
  Filled 2021-05-22: qty 30, 30d supply, fill #3
  Filled 2021-06-29: qty 30, 30d supply, fill #4
  Filled 2021-07-31: qty 30, 30d supply, fill #5
  Filled 2021-09-04: qty 30, 30d supply, fill #6
  Filled 2021-10-02: qty 90, 90d supply, fill #7

## 2021-03-18 ENCOUNTER — Ambulatory Visit (HOSPITAL_COMMUNITY)
Admission: EM | Admit: 2021-03-18 | Discharge: 2021-03-18 | Disposition: A | Discharge status: Home / Self Care | Payer: No Typology Code available for payment source | Attending: Emergency Medicine | Admitting: Emergency Medicine

## 2021-03-18 ENCOUNTER — Telehealth: Payer: Self-pay | Admitting: Emergency Medicine

## 2021-03-18 ENCOUNTER — Other Ambulatory Visit: Payer: Self-pay

## 2021-03-18 ENCOUNTER — Encounter (HOSPITAL_COMMUNITY): Payer: Self-pay | Admitting: Emergency Medicine

## 2021-03-18 ENCOUNTER — Ambulatory Visit (INDEPENDENT_AMBULATORY_CARE_PROVIDER_SITE_OTHER): Payer: No Typology Code available for payment source

## 2021-03-18 DIAGNOSIS — S61210A Laceration without foreign body of right index finger without damage to nail, initial encounter: Secondary | ICD-10-CM

## 2021-03-18 DIAGNOSIS — Z23 Encounter for immunization: Secondary | ICD-10-CM

## 2021-03-18 MED ORDER — POVIDONE-IODINE 10 % EX SOLN
CUTANEOUS | Status: AC
  Filled 2021-03-18: qty 118

## 2021-03-18 MED ORDER — TETANUS-DIPHTH-ACELL PERTUSSIS 5-2.5-18.5 LF-MCG/0.5 IM SUSY
0.5000 mL | PREFILLED_SYRINGE | Freq: Once | INTRAMUSCULAR | Status: AC
  Administered 2021-03-18: 0.5 mL via INTRAMUSCULAR

## 2021-03-18 MED ORDER — CEPHALEXIN 500 MG PO CAPS: 500.0000 mg | ORAL_CAPSULE | Freq: Three times a day (TID) | ORAL | 0 refills | Status: AC

## 2021-03-18 MED ORDER — TETANUS-DIPHTH-ACELL PERTUSSIS 5-2.5-18.5 LF-MCG/0.5 IM SUSY
PREFILLED_SYRINGE | INTRAMUSCULAR | Status: AC
  Filled 2021-03-18: qty 0.5

## 2021-03-18 MED ORDER — CEPHALEXIN 500 MG PO CAPS: 500.0000 mg | ORAL_CAPSULE | Freq: Three times a day (TID) | ORAL | 0 refills | Status: DC

## 2021-03-18 NOTE — ED Triage Notes (Signed)
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  Injury to right index finger

## 2021-03-18 NOTE — Discharge Instructions (Signed)
Take Keflex three times daily for the next seven days.  

## 2021-03-18 NOTE — ED Provider Notes (Signed)
[facility]  ____________________________________________  Time seen: Approximately 12:53 PM  I have reviewed the triage vital signs and the nursing notes.   HISTORY  Chief Complaint Laceration   Historian Patient     HPI Robert Fletcher is a 75 y.o. male presents to the emergency department with a 2 cm right index finger laceration sustained accidentally using electric hedge tremors.  Patient denies numbness or tingling in the right index finger.  His tetanus status is up-to-date.  No other alleviating measures have been attempted.   Past Medical History:  Diagnosis Date  . Diabetes mellitus without complication (HCC)    type 2   . Pneumonia    treated 09/21/17  . Sleep apnea    uses CPAP      Immunizations up to date:  Yes.     Past Medical History:  Diagnosis Date  . Diabetes mellitus without complication (HCC)    type 2   . Pneumonia    treated 09/21/17  . Sleep apnea    uses CPAP     Patient Active Problem List   Diagnosis Date Noted  . DM 08/03/2010  . ESSENTIAL HYPERTENSION, BENIGN 08/03/2010  . SLEEP APNEA 08/03/2010    Past Surgical History:  Procedure Laterality Date  . COLONOSCOPY  11 years ago   Dr.Buccini=normal exam  . TONSILLECTOMY  as child    Prior to Admission medications   Medication Sig Start Date End Date Taking? Authorizing Provider  Dulaglutide 3 MG/0.5ML SOPN INJECT 3 MILLIGRAMS INTO THE SKIN ONCE A WEEK 01/06/21 01/06/22 Yes Kirby Funk, MD  metFORMIN (GLUCOPHAGE) 500 MG tablet Take 1 tablet by mouth daily. 09/20/17  Yes [provider]  Multiple Vitamin (MULTIVITAMIN) tablet Take 1 tablet by mouth daily.   Yes [provider]  Alpha-Lipoic Acid 300 MG CAPS Take 1 capsule by mouth daily.    [provider]  amoxicillin (AMOXIL) 500 MG capsule Take 2 capsules by mouth now then 1 capsule 3 times a day until gone Patient not taking: Reported on 03/18/2021 02/02/21     Cholecalciferol (VITAMIN D3  PO) Take 50 mcg by mouth daily.    [provider]  finasteride (PROSCAR) 5 MG tablet TAKE 1 TABLET BY MOUTH ONCE DAILY. 08/19/20 08/19/21  Marcine Matar, MD  glimepiride (AMARYL) 1 MG tablet Take 1 tablet (1 mg total) by mouth daily with breakfast or the first main meal of the day Patient not taking: Reported on 03/18/2021 02/27/21     influenza vaccine adjuvanted (FLUAD) 0.5 ML injection TO BE ADMINISTERED BY PHARMACIST 08/03/20 08/03/21  Judyann Munson, MD  ipratropium (ATROVENT) 0.06 % nasal spray INSTILL 2 SPRAYS IN EACH NOSTRIL 3 TIMES A DAY AS NEEDED 04/05/20 04/05/21  Kirby Funk, MD  metFORMIN (GLUCOPHAGE-XR) 500 MG 24 hr tablet TAKE 2 TABLETS BY MOUTH 2 TIMES A DAY 08/15/20 08/15/21  Kirby Funk, MD  sildenafil (VIAGRA) 100 MG tablet TAKE 1 TABLET BY MOUTH ONCE A DAY AS NEEDED 08/15/20 08/15/21  Kirby Funk, MD  sildenafil (VIAGRA) 25 MG tablet Take 25 mg by mouth daily as needed for erectile dysfunction.    [provider]  tamsulosin (FLOMAX) 0.4 MG CAPS capsule Take 1 capsule by mouth daily. Patient not taking: Reported on 03/18/2021 08/12/17   [provider]    Allergies Patient has no known allergies.  Family History  Problem Relation Age of Onset  . Colon cancer Neg Hx     Social History Social History  Tobacco Use  . Smoking status: Never Smoker  . Smokeless tobacco: Never Used  Vaping Use  . Vaping Use: Never used  Substance Use Topics  . Alcohol use: No  . Drug use: No     Review of Systems  Constitutional: No fever/chills Eyes:  No discharge ENT: No upper respiratory complaints. Respiratory: no cough. No SOB/ use of accessory muscles to breath Gastrointestinal:   No nausea, no vomiting.  No diarrhea.  No constipation. Musculoskeletal: Patient has right index finger pain.  Skin: Patient has right index finger laceration.    ____________________________________________   PHYSICAL EXAM:  VITAL SIGNS: ED Triage  Vitals  Enc Vitals Group     BP 03/18/21 1219 115/70     Pulse Rate 03/18/21 1219 77     Resp 03/18/21 1219 18     Temp 03/18/21 1219 98.1 F (36.7 C)     Temp Source 03/18/21 1219 Oral     SpO2 03/18/21 1219 94 %     Weight --      Height --      Head Circumference --      Peak Flow --      Pain Score 03/18/21 1215 4     Pain Loc --      Pain Edu? --      Excl. in GC? --      Constitutional: Alert and oriented. Well appearing and in no acute distress. Eyes: Conjunctivae are normal. PERRL. EOMI. Head: Atraumatic. ENT: Cardiovascular: Normal rate, regular rhythm. Normal S1 and S2.  Good peripheral circulation. Respiratory: Normal respiratory effort without tachypnea or retractions. Lungs CTAB. Good air entry to the bases with no decreased or absent breath sounds Gastrointestinal: Bowel sounds x 4 quadrants. Soft and nontender to palpation. No guarding or rigidity. No distention. Musculoskeletal: Full range of motion to all extremities. No obvious deformities noted.  No flexor or extensor tendon deficits appreciated with right index finger testing. Neurologic:  Normal for age. No gross focal neurologic deficits are appreciated.  Skin:  Skin is warm, dry and intact. No rash noted.  Patient had a 2 cm right index finger laceration deep to underlying adipose tissue. Psychiatric: Mood and affect are normal for age. Speech and behavior are normal.   ____________________________________________   LABS (all labs ordered are listed, but only abnormal results are displayed)  Labs Reviewed - No data to display ____________________________________________  EKG   ____________________________________________  RADIOLOGY [name], personally viewed and evaluated these images (plain radiographs) as part of my medical decision making, as well as reviewing the written report by the radiologist.  DG Finger Index Right  Result Date: 03/18/2021 CLINICAL DATA:  75 year old male  with finger laceration. EXAM: RIGHT INDEX FINGER 2+V COMPARISON:  None. FINDINGS: There is no evidence of fracture or dislocation. There is no evidence of arthropathy or other focal bone abnormality. Soft tissues are unremarkable. No radiopaque foreign bodies. IMPRESSION: No acute fracture or malalignment.  No radiopaque foreign bodies. Electronically Signed   By: Marliss Coots MD   On: 03/18/2021 12:50    ____________________________________________    PROCEDURES  Procedure(s) performed:     Laceration Repair  Date/Time: 03/18/2021 12:55 PM Performed by: Orvil Feil, PA-C Authorized by: Orvil Feil, PA-C   Consent:    Consent obtained:  Verbal   Consent given by:  Patient   Risks discussed:  Infection, pain, retained foreign body, poor cosmetic result and poor wound healing Universal protocol:  Procedure explained and questions answered to patient or proxy's satisfaction: yes     Patient identity confirmed:  Verbally with patient Anesthesia:    Anesthesia method:  Local infiltration   Local anesthetic:  Lidocaine 1% w/o epi Laceration details:    Location:  Finger   Finger location:  R index finger   Length (cm):  2   Depth (mm):  5 Pre-procedure details:    Preparation:  Patient was prepped and draped in usual sterile fashion Exploration:    Hemostasis achieved with:  Direct pressure   Contaminated: no   Treatment:    Area cleansed with:  Saline   Amount of cleaning:  Extensive   Irrigation solution:  Sterile saline   Visualized foreign bodies/material removed: no   Skin repair:    Repair method:  Sutures   Suture size:  4-0   Suture material:  Nylon   Number of sutures:  6 Approximation:    Approximation:  Close Repair type:    Repair type:  Simple Post-procedure details:    Dressing:  Sterile dressing   Procedure completion:  Tolerated well, no immediate complications       Medications - No data to  display   ____________________________________________   INITIAL IMPRESSION / ASSESSMENT AND PLAN / ED COURSE  Pertinent labs & imaging results that were available during my care of the patient were reviewed by me and considered in my medical decision making (see chart for details).       Assessment and plan Finger laceration 75 year old male presents to the urgent care with a 2 cm right index finger laceration repaired in the urgent care without complication.  Patient's tetanus status is up-to-date.  No bony abnormalities were visualized on x-ray of the right index finger.  Patient was discharged with Keflex 3 times daily for the next 7 days.  Return precautions were given to return with new or worsening symptoms.    ____________________________________________  FINAL CLINICAL IMPRESSION(S) / ED DIAGNOSES  Final diagnoses:  Laceration of right index finger without foreign body without damage to nail, initial encounter      NEW MEDICATIONS STARTED DURING THIS VISIT:  ED Discharge Orders    None          This chart was dictated using voice recognition software/Dragon. Despite best efforts to proofread, errors can occur which can change the meaning. Any change was purely unintentional.     Orvil Feil, PA-C 03/18/21 1318

## 2021-03-24 ENCOUNTER — Other Ambulatory Visit (HOSPITAL_COMMUNITY): Payer: Self-pay

## 2021-03-31 ENCOUNTER — Other Ambulatory Visit (HOSPITAL_COMMUNITY): Payer: Self-pay

## 2021-03-31 MED FILL — Dulaglutide Soln Auto-injector 3 MG/0.5ML: SUBCUTANEOUS | 84 days supply | Qty: 6 | Fill #0 | Status: AC

## 2021-04-20 ENCOUNTER — Other Ambulatory Visit (HOSPITAL_COMMUNITY): Payer: Self-pay

## 2021-04-23 ENCOUNTER — Other Ambulatory Visit (HOSPITAL_COMMUNITY): Payer: Self-pay

## 2021-04-25 ENCOUNTER — Other Ambulatory Visit (HOSPITAL_COMMUNITY): Payer: Self-pay

## 2021-04-25 MED ORDER — IPRATROPIUM BROMIDE 0.06 % NA SOLN
2.0000 | Freq: Three times a day (TID) | NASAL | 4 refills | Status: AC
  Filled 2021-04-25: qty 45, 41d supply, fill #0
  Filled 2021-12-19: qty 45, 41d supply, fill #1

## 2021-05-09 ENCOUNTER — Other Ambulatory Visit (HOSPITAL_COMMUNITY): Payer: Self-pay

## 2021-05-09 MED ORDER — CARESTART COVID-19 HOME TEST VI KIT
PACK | 0 refills | Status: AC
  Filled 2021-05-09: qty 4, 4d supply, fill #0

## 2021-05-22 ENCOUNTER — Other Ambulatory Visit (HOSPITAL_COMMUNITY): Payer: Self-pay

## 2021-05-22 MED FILL — Finasteride Tab 5 MG: ORAL | 90 days supply | Qty: 90 | Fill #1 | Status: AC

## 2021-05-22 MED FILL — Sildenafil Citrate Tab 100 MG: ORAL | 30 days supply | Qty: 6 | Fill #1 | Status: AC

## 2021-06-24 MED FILL — Dulaglutide Soln Auto-injector 3 MG/0.5ML: SUBCUTANEOUS | 84 days supply | Qty: 6 | Fill #1 | Status: AC

## 2021-06-27 ENCOUNTER — Other Ambulatory Visit (HOSPITAL_COMMUNITY): Payer: Self-pay

## 2021-06-30 ENCOUNTER — Other Ambulatory Visit (HOSPITAL_COMMUNITY): Payer: Self-pay

## 2021-07-12 ENCOUNTER — Other Ambulatory Visit (HOSPITAL_COMMUNITY): Payer: Self-pay

## 2021-07-31 ENCOUNTER — Other Ambulatory Visit (HOSPITAL_COMMUNITY): Payer: Self-pay

## 2021-08-17 ENCOUNTER — Other Ambulatory Visit (HOSPITAL_COMMUNITY): Payer: Self-pay

## 2021-08-17 MED ORDER — METFORMIN HCL ER 500 MG PO TB24
500.0000 mg | ORAL_TABLET | Freq: Two times a day (BID) | ORAL | 3 refills | Status: AC
  Filled 2021-08-17: qty 180, 90d supply, fill #0

## 2021-08-21 ENCOUNTER — Other Ambulatory Visit (HOSPITAL_COMMUNITY): Payer: Self-pay

## 2021-08-22 ENCOUNTER — Other Ambulatory Visit (HOSPITAL_COMMUNITY): Payer: Self-pay

## 2021-08-23 ENCOUNTER — Other Ambulatory Visit (HOSPITAL_COMMUNITY): Payer: Self-pay

## 2021-08-23 MED ORDER — SILDENAFIL CITRATE 100 MG PO TABS
100.0000 mg | ORAL_TABLET | Freq: Every day | ORAL | 12 refills | Status: AC | PRN
  Filled 2021-08-23: qty 6, 30d supply, fill #0
  Filled 2022-02-19: qty 6, 30d supply, fill #1

## 2021-08-24 ENCOUNTER — Other Ambulatory Visit (HOSPITAL_COMMUNITY): Payer: Self-pay

## 2021-09-04 ENCOUNTER — Other Ambulatory Visit: Payer: Self-pay | Admitting: Urology

## 2021-09-04 ENCOUNTER — Other Ambulatory Visit (HOSPITAL_COMMUNITY): Payer: Self-pay

## 2021-09-04 MED ORDER — FINASTERIDE 5 MG PO TABS
5.0000 mg | ORAL_TABLET | Freq: Every day | ORAL | 3 refills | Status: AC
  Filled 2021-09-04: qty 90, 90d supply, fill #0
  Filled 2022-02-19: qty 90, 90d supply, fill #1
  Filled 2022-05-03: qty 90, 90d supply, fill #2
  Filled 2022-08-30: qty 90, 90d supply, fill #3

## 2021-09-19 ENCOUNTER — Other Ambulatory Visit (HOSPITAL_COMMUNITY): Payer: Self-pay

## 2021-09-19 MED FILL — Dulaglutide Soln Auto-injector 3 MG/0.5ML: SUBCUTANEOUS | 84 days supply | Qty: 6 | Fill #2 | Status: AC

## 2021-09-21 ENCOUNTER — Other Ambulatory Visit (HOSPITAL_COMMUNITY): Payer: Self-pay

## 2021-10-02 ENCOUNTER — Other Ambulatory Visit (HOSPITAL_COMMUNITY): Payer: Self-pay

## 2021-11-21 ENCOUNTER — Other Ambulatory Visit (HOSPITAL_COMMUNITY): Payer: Self-pay

## 2021-11-21 MED ORDER — JANUVIA 50 MG PO TABS
50.0000 mg | ORAL_TABLET | Freq: Every day | ORAL | 0 refills | Status: DC
  Filled 2021-11-21: qty 30, 30d supply, fill #0
  Filled 2021-12-19: qty 30, 30d supply, fill #1
  Filled 2022-01-17: qty 30, 30d supply, fill #2

## 2021-11-21 MED ORDER — METFORMIN HCL ER (OSM) 500 MG PO TB24
500.0000 mg | ORAL_TABLET | Freq: Two times a day (BID) | ORAL | 0 refills | Status: DC
  Filled 2021-11-21: qty 180, 90d supply, fill #0

## 2021-11-22 ENCOUNTER — Other Ambulatory Visit (HOSPITAL_COMMUNITY): Payer: Self-pay

## 2021-11-22 MED ORDER — METFORMIN HCL ER 500 MG PO TB24
500.0000 mg | ORAL_TABLET | Freq: Two times a day (BID) | ORAL | 3 refills | Status: AC
  Filled 2021-11-22: qty 60, 30d supply, fill #0
  Filled 2021-12-19: qty 60, 30d supply, fill #1
  Filled 2022-01-26: qty 60, 30d supply, fill #2

## 2021-11-23 ENCOUNTER — Other Ambulatory Visit (HOSPITAL_COMMUNITY): Payer: Self-pay

## 2021-12-19 ENCOUNTER — Other Ambulatory Visit (HOSPITAL_COMMUNITY): Payer: Self-pay

## 2021-12-20 ENCOUNTER — Other Ambulatory Visit (HOSPITAL_COMMUNITY): Payer: Self-pay

## 2022-01-15 ENCOUNTER — Other Ambulatory Visit: Payer: Self-pay

## 2022-01-17 ENCOUNTER — Other Ambulatory Visit (HOSPITAL_COMMUNITY): Payer: Self-pay

## 2022-01-26 ENCOUNTER — Other Ambulatory Visit (HOSPITAL_COMMUNITY): Payer: Self-pay

## 2022-02-16 ENCOUNTER — Other Ambulatory Visit (HOSPITAL_COMMUNITY): Payer: Self-pay

## 2022-02-19 ENCOUNTER — Other Ambulatory Visit (HOSPITAL_COMMUNITY): Payer: Self-pay

## 2022-02-19 MED ORDER — JANUVIA 50 MG PO TABS
50.0000 mg | ORAL_TABLET | Freq: Every day | ORAL | 1 refills | Status: AC
  Filled 2022-02-19: qty 30, 30d supply, fill #0

## 2022-02-20 ENCOUNTER — Other Ambulatory Visit (HOSPITAL_COMMUNITY): Payer: Self-pay

## 2022-02-22 ENCOUNTER — Other Ambulatory Visit (HOSPITAL_COMMUNITY): Payer: Self-pay

## 2022-02-22 MED ORDER — FUROSEMIDE 40 MG PO TABS
40.0000 mg | ORAL_TABLET | Freq: Every day | ORAL | 2 refills | Status: AC | PRN
  Filled 2022-02-22: qty 15, 15d supply, fill #0
  Filled 2022-10-19 (×2): qty 15, 15d supply, fill #1

## 2022-02-23 ENCOUNTER — Other Ambulatory Visit: Payer: Self-pay | Admitting: Internal Medicine

## 2022-02-23 ENCOUNTER — Other Ambulatory Visit (HOSPITAL_COMMUNITY): Payer: Self-pay

## 2022-02-23 ENCOUNTER — Ambulatory Visit
Admission: RE | Admit: 2022-02-23 | Discharge: 2022-02-23 | Disposition: A | Discharge status: Home / Self Care | Payer: No Typology Code available for payment source | Source: Ambulatory Visit | Attending: Internal Medicine | Admitting: Internal Medicine

## 2022-02-23 DIAGNOSIS — R7989 Other specified abnormal findings of blood chemistry: Secondary | ICD-10-CM

## 2022-03-05 ENCOUNTER — Other Ambulatory Visit (HOSPITAL_COMMUNITY): Payer: Self-pay

## 2022-04-20 ENCOUNTER — Other Ambulatory Visit (HOSPITAL_COMMUNITY): Payer: Self-pay

## 2022-05-03 ENCOUNTER — Other Ambulatory Visit (HOSPITAL_COMMUNITY): Payer: Self-pay

## 2022-06-01 ENCOUNTER — Other Ambulatory Visit (HOSPITAL_COMMUNITY): Payer: Self-pay

## 2022-06-01 MED ORDER — GLIMEPIRIDE 1 MG PO TABS
1.0000 mg | ORAL_TABLET | Freq: Every day | ORAL | 11 refills | Status: AC
  Filled 2022-06-01: qty 30, 30d supply, fill #0

## 2022-06-01 MED ORDER — GLIMEPIRIDE 2 MG PO TABS
1.0000 mg | ORAL_TABLET | Freq: Every day | ORAL | 11 refills | Status: AC
  Filled 2022-06-01: qty 30, 60d supply, fill #0

## 2022-06-04 ENCOUNTER — Other Ambulatory Visit (HOSPITAL_COMMUNITY): Payer: Self-pay

## 2022-06-04 MED ORDER — AMOXICILLIN 500 MG PO CAPS
500.0000 mg | ORAL_CAPSULE | Freq: Three times a day (TID) | ORAL | 1 refills | Status: AC
  Filled 2022-06-04: qty 21, 7d supply, fill #0
  Filled 2022-06-11: qty 21, 7d supply, fill #1

## 2022-06-11 ENCOUNTER — Other Ambulatory Visit: Payer: Self-pay | Admitting: Internal Medicine

## 2022-06-11 ENCOUNTER — Other Ambulatory Visit (HOSPITAL_COMMUNITY): Payer: Self-pay

## 2022-06-11 DIAGNOSIS — N179 Acute kidney failure, unspecified: Secondary | ICD-10-CM

## 2022-06-12 ENCOUNTER — Ambulatory Visit
Admission: RE | Admit: 2022-06-12 | Discharge: 2022-06-12 | Disposition: A | Discharge status: Home / Self Care | Payer: No Typology Code available for payment source | Source: Ambulatory Visit | Attending: Internal Medicine | Admitting: Internal Medicine

## 2022-06-12 DIAGNOSIS — N179 Acute kidney failure, unspecified: Secondary | ICD-10-CM

## 2022-06-14 ENCOUNTER — Other Ambulatory Visit (HOSPITAL_COMMUNITY): Payer: Self-pay

## 2022-06-14 MED ORDER — TRULICITY 3 MG/0.5ML ~~LOC~~ SOAJ
3.0000 mg | SUBCUTANEOUS | 11 refills | Status: DC
  Filled 2022-06-14: qty 2, 28d supply, fill #0
  Filled 2022-07-09: qty 2, 28d supply, fill #1
  Filled 2022-08-05: qty 2, 28d supply, fill #2
  Filled 2022-08-30: qty 2, 28d supply, fill #3
  Filled 2022-09-30: qty 2, 28d supply, fill #4
  Filled 2022-11-02 (×2): qty 2, 28d supply, fill #5
  Filled 2022-12-01: qty 2, 28d supply, fill #6
  Filled 2023-01-07: qty 2, 28d supply, fill #7
  Filled 2023-01-28 – 2023-01-31 (×2): qty 2, 28d supply, fill #8
  Filled 2023-02-23: qty 2, 28d supply, fill #9
  Filled 2023-03-22: qty 2, 28d supply, fill #10

## 2022-06-18 ENCOUNTER — Other Ambulatory Visit (HOSPITAL_COMMUNITY): Payer: Self-pay | Admitting: Internal Medicine

## 2022-06-18 ENCOUNTER — Other Ambulatory Visit: Payer: Self-pay | Admitting: Internal Medicine

## 2022-06-18 DIAGNOSIS — N179 Acute kidney failure, unspecified: Secondary | ICD-10-CM

## 2022-06-21 ENCOUNTER — Other Ambulatory Visit: Payer: Self-pay | Admitting: Radiology

## 2022-06-21 DIAGNOSIS — N179 Acute kidney failure, unspecified: Secondary | ICD-10-CM

## 2022-06-26 ENCOUNTER — Other Ambulatory Visit: Payer: Self-pay | Admitting: Student

## 2022-06-26 ENCOUNTER — Other Ambulatory Visit: Payer: Self-pay | Admitting: Radiology

## 2022-06-26 NOTE — H&P (Signed)
Chief Complaint: Patient was seen in consultation today for acute kidney injury  Referring Physician(s): Jannifer Hick A  Supervising Physician: Corrie Mckusick  Patient Status: [facility] - Out[facility]  History of Present Illness: Robert Fletcher is a 76 y.o. male with past medical history of DM2, sleep apnea with progressive acute kidney injury without known cause monitored by Dr. Johnney Ou for the past two years. He is referred to [facility] for random renal biopsy.   Robert Fletcher presents to IR today in his usual state of health.  He has been NPO. He does not take blood thinners.  Denies any new symptoms or concerns.  He is understanding of the procedure today and is agreeable to proceed.   Patient's wife, Robert Fletcher, will be available for care and transportation post procedure today.    Past Medical History:  Diagnosis Date   Diabetes mellitus without complication ([place])    type 2    Pneumonia    treated 09/21/17   Sleep apnea    uses CPAP     Past Surgical History:  Procedure Laterality Date   COLONOSCOPY  11 years ago   Dr.Buccini=normal exam   TONSILLECTOMY  as child    Allergies: Patient has no known allergies.  Medications: Prior to Admission medications   Medication Sig Start Date End Date Taking? Authorizing Provider  Alpha-Lipoic Acid 300 MG CAPS Take 1 capsule by mouth daily.    [provider]  amoxicillin (AMOXIL) 500 MG capsule Take 2 capsules by mouth now then 1 capsule 3 times a day until gone Patient not taking: Reported on 03/18/2021 02/02/21     amoxicillin (AMOXIL) 500 MG capsule Take 2 capsules (1,016m total) now then take 1 capsule (500 mg total) by mouth 3 (three) times daily until finished. 06/04/22     Cholecalciferol (VITAMIN D3 PO) Take 50 mcg by mouth daily.    [provider]  [facility]OVID-19 At [facility] Antigen Test [facility] KIT Use as directed 05/09/21   FJefm Bryant RPH  Dulaglutide (TRULICITY) 3 MSV/7.7LTSOPN Inject 3 mg into  the skin once a week. 06/13/22     finasteride (PROSCAR) 5 MG tablet Take 1 tablet (5 mg total) by mouth daily. 09/04/21     furosemide (LASIX) 40 MG tablet Take 1 tablet (40 mg total) by mouth daily as needed until the swelling in your legs resolves. 02/22/22     glimepiride (AMARYL) 1 MG tablet Take 1 tablet (1 mg total) by mouth daily with breakfast for the first main meal of the day. 06/01/22     glimepiride (AMARYL) 2 MG tablet Take 0.5 tablets (1 mg total) by mouth daily. 06/01/22     ipratropium (ATROVENT) 0.06 % nasal spray INSTILL 2 SPRAYS IN EACH NOSTRIL 3 TIMES A DAY AS NEEDED 04/05/20 04/05/21  GLavone Orn MD  ipratropium (ATROVENT) 0.06 % nasal spray Use 2 sprays in each nostril 3 times daily as needed 04/25/21     metFORMIN (GLUCOPHAGE) 500 MG tablet Take 1 tablet by mouth daily. 09/20/17   [provider]  metFORMIN (GLUCOPHAGE-XR) 500 MG 24 hr tablet TAKE 2 TABLETS BY MOUTH 2 TIMES A DAY 08/15/20 08/15/21  GLavone Orn MD  metFORMIN (GLUCOPHAGE-XR) 500 MG 24 hr tablet Take 1 tablet (500 mg total) by mouth 2 (two) times daily with evening meal 08/17/21     metFORMIN (GLUCOPHAGE-XR) 500 MG 24 hr tablet Take 1 tablet (500 mg total) by mouth 2 (two) times daily. 11/22/21  Multiple Vitamin (MULTIVITAMIN) tablet Take 1 tablet by mouth daily.    [provider]  sildenafil (VIAGRA) 100 MG tablet TAKE 1 TABLET BY MOUTH ONCE A DAY AS NEEDED 08/15/20 08/15/21  Lavone Orn, MD  sildenafil (VIAGRA) 100 MG tablet Take 1 tablet (100 mg total) by mouth daily as needed. 08/23/21     sildenafil (VIAGRA) 25 MG tablet Take 25 mg by mouth daily as needed for erectile dysfunction.    [provider]  sitaGLIPtin (JANUVIA) 50 MG tablet Take 1 tablet (50 mg total) by mouth daily. 02/19/22     tamsulosin (FLOMAX) 0.4 MG CAPS capsule Take 1 capsule by mouth daily. Patient not taking: Reported on 03/18/2021 08/12/17   [provider]     Family History  Problem Relation Age  of Onset   Colon cancer Neg Hx     Social History   Socioeconomic History   Marital status: Married    Spouse name: Not on file   Number of children: Not on file   Years of education: Not on file   Highest education level: Not on file  Occupational History   Not on file  Tobacco Use   Smoking status: Never   Smokeless tobacco: Never  Vaping Use   Vaping Use: Never used  Substance and Sexual Activity   Alcohol use: No   Drug use: No   Sexual activity: Not on file  Other Topics Concern   Not on file  Social History Narrative   Not on file   Social Determinants of Health   Financial Resource Strain: Not on file  Food Insecurity: Not on file  Transportation Needs: Not on file  Physical Activity: Not on file  Stress: Not on file  Social Connections: Not on file     Review of Systems: A 12 point ROS discussed and pertinent positives are indicated in the HPI above.  All other systems are negative.  Review of Systems  Constitutional:  Negative for fatigue and fever.  Respiratory:  Negative for cough and shortness of breath.   Cardiovascular:  Negative for chest pain.  Gastrointestinal:  Negative for abdominal pain.  Genitourinary:  Negative for dysuria.  Musculoskeletal:  Negative for back pain.  Psychiatric/Behavioral:  Negative for behavioral problems and confusion.     Vital Signs: BP (!) 154/61   Pulse 66   Temp 97.6 F (36.4 C) (Temporal)   Resp 17   Ht 5' 11"  (1.803 m)   Wt 188 lb (85.3 kg)   SpO2 100%   BMI 26.22 kg/m   Physical Exam Vitals and nursing note reviewed.  Constitutional:      General: He is not in acute distress.    Appearance: Normal appearance. He is not ill-appearing.  HENT:     Mouth/Throat:     Mouth: Mucous membranes are moist.     Pharynx: Oropharynx is clear.  Cardiovascular:     Rate and Rhythm: Normal rate and regular rhythm.  Pulmonary:     Effort: Pulmonary effort is normal.     Breath sounds: Normal breath sounds.   Abdominal:     General: Abdomen is flat.     Palpations: Abdomen is soft.  Skin:    General: Skin is warm and dry.  Neurological:     General: No focal deficit present.     Mental Status: He is alert and oriented to person, place, and time. Mental status is at baseline.  Psychiatric:  Mood and Affect: Mood normal.        Behavior: Behavior normal.        Thought Content: Thought content normal.        Judgment: Judgment normal.      MD Evaluation Airway: WNL Heart: WNL Abdomen: WNL Chest/ Lungs: WNL ASA  Classification: 3 Mallampati/Airway Score: Two   Imaging: US RENAL  Result Date: 06/12/2022 CLINICAL DATA:  AKI (acute kidney injury) ([place]) EXAM: RENAL / URINARY TRACT ULTRASOUND COMPLETE COMPARISON:  Feb 23, 2022 FINDINGS: Right Kidney: Renal measurements: 10.8 x 5.0 x 6.6 cm = volume: 185 mL. Echogenicity within normal limits. No mass or hydronephrosis visualized. Left Kidney: Renal measurements: 11.0 x 5.0 x 5.0 cm = volume: 144 mL. Echogenicity within normal limits. No mass or hydronephrosis visualized. Bladder: There is median lobe hypertrophy and prostatomegaly measuring approximately 61 ML. Debris is noted to be floating within the bladder. The luminal surface of the posterior bladder wall is diffusely undulating and irregular in contour. Other: None. IMPRESSION: 1. No hydronephrosis. 2. Irregular undulating and thickened contour of the posterior aspect of the bladder wall. This could reflect underlying trabeculation from chronic outlet obstruction or cystitis given presence of floating debris. Luminal malignancy is felt less likely but remains in the differential. Recommend correlation with urine analysis and cystoscopy as needed. 3. Prostatomegaly. These results will be called to the ordering clinician or representative by the Radiologist Assistant, and communication documented in the PACS or [employer]. Electronically Signed   By: Valentino Saxon M.D.   On:  06/12/2022 16:54    Labs:  CBC: Recent Labs    06/27/22 0632  WBC 7.9  HGB 12.4*  HCT 35.2*  PLT 281    COAGS: Recent Labs    06/27/22 0632  INR 1.1    BMP: No results for input(s): "NA", "K", "CL", "CO2", "GLUCOSE", "BUN", "CALCIUM", "CREATININE", "GFRNONAA", "GFRAA" in the last 8760 hours.  Invalid input(s): "CMP"  LIVER FUNCTION TESTS: No results for input(s): "BILITOT", "AST", "ALT", "ALKPHOS", "PROT", "ALBUMIN" in the last 8760 hours.  TUMOR MARKERS: No results for input(s): "AFPTM", "CEA", "CA199", "CHROMGRNA" in the last 8760 hours.  Assessment and Plan: Patient with past medical history of DM2, sleep apnea presents with complaint of progressive acute kidney injury.  IR consulted for random renal biopsy at the request of Dr. Johnney Ou. Case reviewed by Dr. Earleen Newport who approves patient for procedure.  Patient presents today in their usual state of health.  He has been NPO and is not currently on blood thinners.   Risks and benefits of biopsy was discussed with the patient and/or patient's family including, but not limited to bleeding, infection, damage to adjacent structures or low yield requiring additional tests.  All of the questions were answered and there is agreement to proceed.  Consent signed and in chart.   Thank you for this interesting consult.  I greatly enjoyed meeting MASAICHI KRACHT and look forward to participating in their care.  A copy of this report was sent to the requesting provider on this date.  Electronically Signed: Docia Barrier, PA 06/27/2022, 8:08 AM   I spent a total of  30 Minutes   in face to face in clinical consultation, greater than 50% of which was counseling/coordinating care for acute kidney injury

## 2022-06-27 ENCOUNTER — Other Ambulatory Visit: Payer: Self-pay

## 2022-06-27 ENCOUNTER — Ambulatory Visit (HOSPITAL_COMMUNITY)
Admission: RE | Admit: 2022-06-27 | Discharge: 2022-06-27 | Disposition: A | Discharge status: Home / Self Care | Payer: No Typology Code available for payment source | Source: Ambulatory Visit | Attending: Internal Medicine | Admitting: Internal Medicine

## 2022-06-27 ENCOUNTER — Encounter (HOSPITAL_COMMUNITY): Payer: Self-pay

## 2022-06-27 DIAGNOSIS — G4730 Sleep apnea, unspecified: Secondary | ICD-10-CM | POA: Insufficient documentation

## 2022-06-27 DIAGNOSIS — N289 Disorder of kidney and ureter, unspecified: Secondary | ICD-10-CM | POA: Diagnosis present

## 2022-06-27 DIAGNOSIS — E119 Type 2 diabetes mellitus without complications: Secondary | ICD-10-CM | POA: Diagnosis not present

## 2022-06-27 DIAGNOSIS — N179 Acute kidney failure, unspecified: Secondary | ICD-10-CM | POA: Insufficient documentation

## 2022-06-27 LAB — CBC
HCT: 35.2 % — ABNORMAL LOW (ref 39.0–52.0)
Hemoglobin: 12.4 g/dL — ABNORMAL LOW (ref 13.0–17.0)
MCH: 33.4 pg (ref 26.0–34.0)
MCHC: 35.2 g/dL (ref 30.0–36.0)
MCV: 94.9 fL (ref 80.0–100.0)
Platelets: 281 10*3/uL (ref 150–400)
RBC: 3.71 MIL/uL — ABNORMAL LOW (ref 4.22–5.81)
RDW: 11.9 % (ref 11.5–15.5)
WBC: 7.9 10*3/uL (ref 4.0–10.5)
nRBC: 0 % (ref 0.0–0.2)

## 2022-06-27 LAB — GLUCOSE, CAPILLARY: Glucose-Capillary: 174 mg/dL — ABNORMAL HIGH (ref 70–99)

## 2022-06-27 LAB — PROTIME-INR
INR: 1.1 (ref 0.8–1.2)
Prothrombin Time: 14 seconds (ref 11.4–15.2)

## 2022-06-27 MED ORDER — MIDAZOLAM HCL 2 MG/2ML IJ SOLN
INTRAMUSCULAR | Status: AC
  Filled 2022-06-27: qty 2

## 2022-06-27 MED ORDER — SODIUM CHLORIDE 0.9 % IV SOLN: INTRAVENOUS | Status: DC

## 2022-06-27 MED ORDER — FENTANYL CITRATE (PF) 100 MCG/2ML IJ SOLN
INTRAMUSCULAR | Status: AC | PRN
  Administered 2022-06-27: 25 ug via INTRAVENOUS
  Administered 2022-06-27: 50 ug via INTRAVENOUS

## 2022-06-27 MED ORDER — FENTANYL CITRATE (PF) 100 MCG/2ML IJ SOLN
INTRAMUSCULAR | Status: AC
  Filled 2022-06-27: qty 2

## 2022-06-27 MED ORDER — LIDOCAINE HCL (PF) 1 % IJ SOLN
INTRAMUSCULAR | Status: AC
  Filled 2022-06-27: qty 30

## 2022-06-27 MED ORDER — GELATIN ABSORBABLE 12-7 MM EX MISC
CUTANEOUS | Status: AC
  Filled 2022-06-27: qty 1

## 2022-06-27 MED ORDER — MIDAZOLAM HCL 2 MG/2ML IJ SOLN
INTRAMUSCULAR | Status: AC | PRN
  Administered 2022-06-27: .5 mg via INTRAVENOUS
  Administered 2022-06-27: 1 mg via INTRAVENOUS

## 2022-06-27 NOTE — Procedures (Signed)
Interventional Radiology Procedure Note  Procedure: US guided biopsy of left kidney, medical renal Complications: None EBL: None Recommendations: - Bedrest 2 hours.   - Routine wound care - Follow up pathology - Advance diet   Signed,  Augusten Lipkin, DO   

## 2022-06-27 NOTE — Progress Notes (Signed)
Patient and wife was given discharge instructions. Both verbalized understanding. 

## 2022-07-09 ENCOUNTER — Other Ambulatory Visit (HOSPITAL_COMMUNITY): Payer: Self-pay

## 2022-07-11 ENCOUNTER — Other Ambulatory Visit (HOSPITAL_COMMUNITY): Payer: Self-pay

## 2022-07-12 ENCOUNTER — Other Ambulatory Visit (HOSPITAL_COMMUNITY): Payer: Self-pay

## 2022-07-12 MED ORDER — GLIMEPIRIDE 1 MG PO TABS
1.0000 mg | ORAL_TABLET | Freq: Every day | ORAL | 3 refills | Status: AC
  Filled 2022-07-12: qty 90, 90d supply, fill #0
  Filled 2022-10-17: qty 90, 90d supply, fill #1

## 2022-07-12 MED ORDER — FINASTERIDE 5 MG PO TABS
5.0000 mg | ORAL_TABLET | Freq: Every day | ORAL | 3 refills | Status: DC
  Filled 2022-07-12 – 2023-01-15 (×2): qty 90, 90d supply, fill #0
  Filled 2023-04-16: qty 90, 90d supply, fill #1

## 2022-07-12 MED ORDER — SILDENAFIL CITRATE 100 MG PO TABS
100.0000 mg | ORAL_TABLET | Freq: Every day | ORAL | 11 refills | Status: DC | PRN
  Filled 2022-07-12: qty 10, 10d supply, fill #0
  Filled 2023-03-22: qty 6, 30d supply, fill #1
  Filled 2023-06-13: qty 6, 30d supply, fill #2

## 2022-07-12 MED ORDER — IPRATROPIUM BROMIDE 0.06 % NA SOLN
2.0000 | Freq: Three times a day (TID) | NASAL | 11 refills | Status: DC | PRN
  Filled 2022-07-12: qty 45, 41d supply, fill #0
  Filled 2023-03-25: qty 45, 41d supply, fill #1
  Filled 2023-06-11: qty 45, 60d supply, fill #2

## 2022-07-13 ENCOUNTER — Encounter (HOSPITAL_COMMUNITY): Payer: Self-pay

## 2022-07-13 LAB — SURGICAL PATHOLOGY

## 2022-08-06 ENCOUNTER — Other Ambulatory Visit (HOSPITAL_COMMUNITY): Payer: Self-pay

## 2022-08-09 ENCOUNTER — Other Ambulatory Visit (HOSPITAL_COMMUNITY): Payer: Self-pay

## 2022-08-09 MED ORDER — TRULICITY 3 MG/0.5ML ~~LOC~~ SOAJ: 3.0000 mg | SUBCUTANEOUS | 1 refills | Status: AC

## 2022-08-09 MED ORDER — GLIMEPIRIDE 1 MG PO TABS
1.0000 mg | ORAL_TABLET | Freq: Every day | ORAL | 1 refills | Status: AC
  Filled 2022-08-09 – 2023-01-07 (×2): qty 90, 90d supply, fill #0
  Filled 2023-04-16: qty 90, 90d supply, fill #1

## 2022-08-22 ENCOUNTER — Other Ambulatory Visit (HOSPITAL_COMMUNITY): Payer: Self-pay

## 2022-08-23 IMAGING — DX DG FINGER INDEX 2+V*R*
3 series · 3 of 3 positions shown · non-contrast
Comparison: None.

CLINICAL DATA: 74-year-old male with finger laceration.

EXAM:
RIGHT INDEX FINGER 2+V

[finger ap]
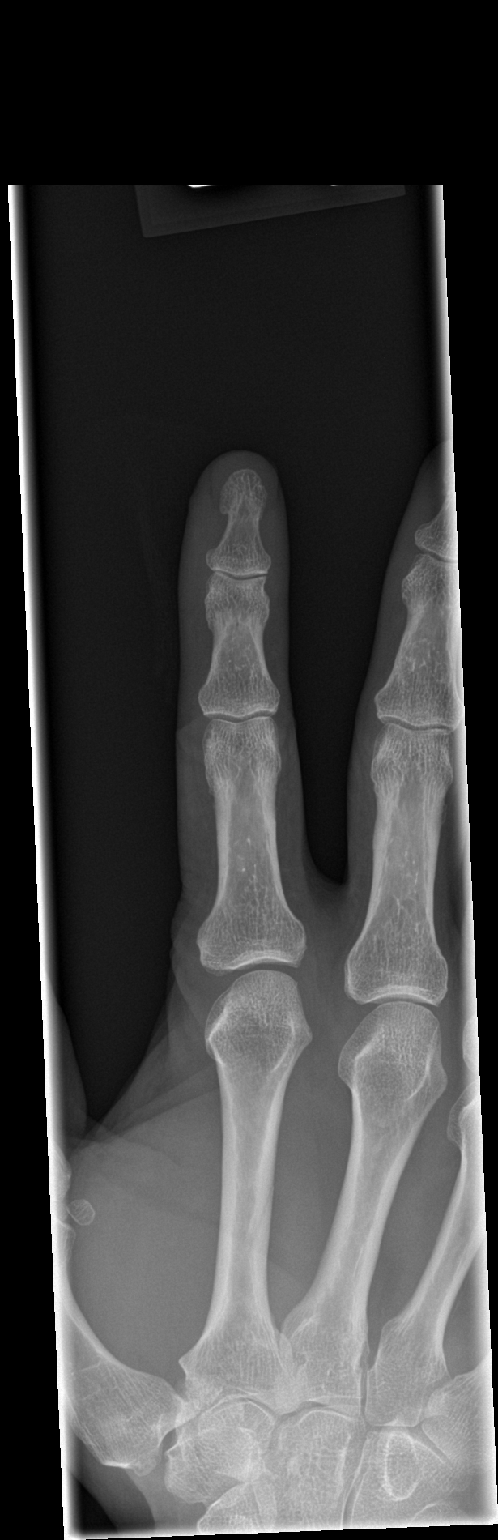

[finger obl]
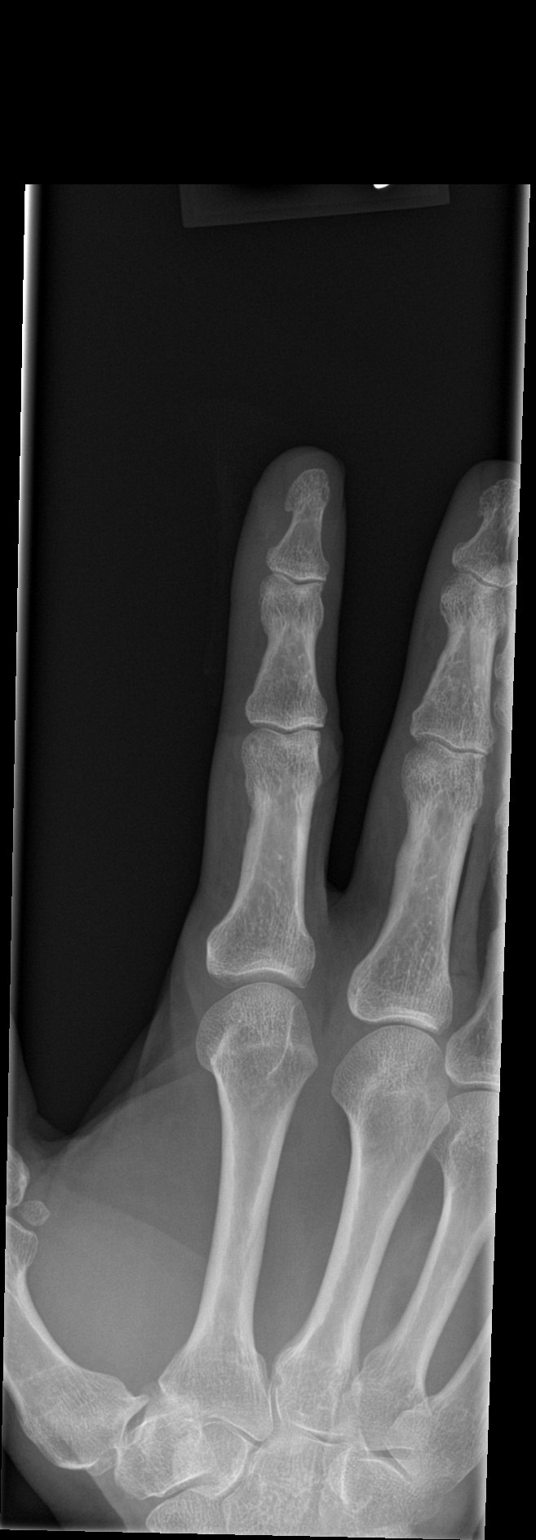

[finger lat]
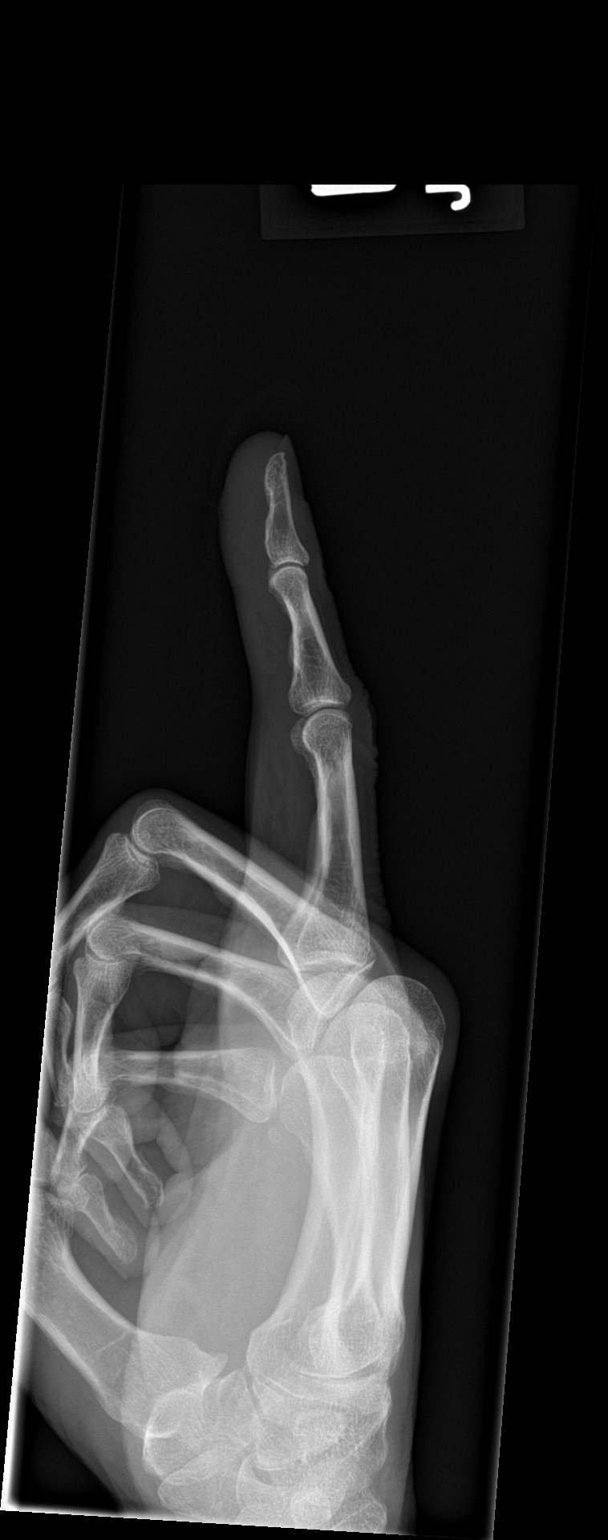

[3 of 3 positions shown; findings below may reference images not displayed]

FINDINGS: There is no evidence of fracture or dislocation. There is no
evidence of arthropathy or other focal bone abnormality. Soft
tissues are unremarkable. No radiopaque foreign bodies.
IMPRESSION: No acute fracture or malalignment.  No radiopaque foreign bodies.

## 2022-08-30 ENCOUNTER — Other Ambulatory Visit (HOSPITAL_COMMUNITY): Payer: Self-pay

## 2022-10-04 ENCOUNTER — Other Ambulatory Visit (HOSPITAL_COMMUNITY): Payer: Self-pay

## 2022-10-18 ENCOUNTER — Other Ambulatory Visit (HOSPITAL_COMMUNITY): Payer: Self-pay

## 2022-10-19 ENCOUNTER — Other Ambulatory Visit: Payer: Self-pay

## 2022-10-19 ENCOUNTER — Other Ambulatory Visit (HOSPITAL_COMMUNITY): Payer: Self-pay

## 2022-10-23 DIAGNOSIS — E1165 Type 2 diabetes mellitus with hyperglycemia: Secondary | ICD-10-CM | POA: Diagnosis not present

## 2022-10-23 DIAGNOSIS — G4733 Obstructive sleep apnea (adult) (pediatric): Secondary | ICD-10-CM | POA: Diagnosis not present

## 2022-10-23 DIAGNOSIS — M7989 Other specified soft tissue disorders: Secondary | ICD-10-CM | POA: Diagnosis not present

## 2022-10-23 DIAGNOSIS — N184 Chronic kidney disease, stage 4 (severe): Secondary | ICD-10-CM | POA: Diagnosis not present

## 2022-11-02 ENCOUNTER — Other Ambulatory Visit (HOSPITAL_COMMUNITY): Payer: Self-pay

## 2022-12-10 DIAGNOSIS — N184 Chronic kidney disease, stage 4 (severe): Secondary | ICD-10-CM | POA: Diagnosis not present

## 2022-12-19 ENCOUNTER — Other Ambulatory Visit (HOSPITAL_COMMUNITY): Payer: Self-pay

## 2022-12-19 MED ORDER — TAMSULOSIN HCL 0.4 MG PO CAPS
0.4000 mg | ORAL_CAPSULE | Freq: Every day | ORAL | 3 refills | Status: DC
  Filled 2022-12-19: qty 90, 90d supply, fill #0
  Filled 2023-03-22 – 2023-03-25 (×2): qty 90, 90d supply, fill #1
  Filled 2023-06-27: qty 90, 90d supply, fill #2
  Filled 2023-10-08: qty 90, 90d supply, fill #3

## 2022-12-21 ENCOUNTER — Other Ambulatory Visit (HOSPITAL_COMMUNITY): Payer: Self-pay

## 2022-12-24 ENCOUNTER — Other Ambulatory Visit (HOSPITAL_COMMUNITY): Payer: Self-pay

## 2022-12-24 MED ORDER — GLIMEPIRIDE 1 MG PO TABS
1.0000 mg | ORAL_TABLET | Freq: Every day | ORAL | 0 refills | Status: AC
  Filled 2022-12-24: qty 30, 30d supply, fill #0

## 2022-12-24 MED ORDER — DEXCOM G7 SENSOR MISC
Freq: Every day | 6 refills | Status: AC
  Filled 2022-12-24: qty 3, 30d supply, fill #0

## 2022-12-24 MED ORDER — FREESTYLE LIBRE 14 DAY SENSOR MISC
Freq: Every day | 8 refills | Status: AC
  Filled 2022-12-24 – 2023-01-04 (×3): qty 2, 28d supply, fill #0
  Filled 2023-02-23 – 2023-02-25 (×3): qty 2, 28d supply, fill #1
  Filled 2023-03-22: qty 2, 28d supply, fill #2
  Filled 2023-04-18: qty 2, 28d supply, fill #3
  Filled 2023-05-20: qty 2, 28d supply, fill #4

## 2022-12-26 ENCOUNTER — Other Ambulatory Visit (HOSPITAL_COMMUNITY): Payer: Self-pay

## 2023-01-04 ENCOUNTER — Other Ambulatory Visit (HOSPITAL_COMMUNITY): Payer: Self-pay

## 2023-01-07 ENCOUNTER — Other Ambulatory Visit (HOSPITAL_COMMUNITY): Payer: Self-pay

## 2023-01-08 ENCOUNTER — Other Ambulatory Visit (HOSPITAL_COMMUNITY): Payer: Self-pay

## 2023-01-09 ENCOUNTER — Other Ambulatory Visit (HOSPITAL_COMMUNITY): Payer: Self-pay

## 2023-01-11 ENCOUNTER — Other Ambulatory Visit (HOSPITAL_COMMUNITY): Payer: Self-pay

## 2023-01-15 ENCOUNTER — Other Ambulatory Visit (HOSPITAL_COMMUNITY): Payer: Self-pay

## 2023-01-16 DIAGNOSIS — G4733 Obstructive sleep apnea (adult) (pediatric): Secondary | ICD-10-CM | POA: Diagnosis not present

## 2023-01-16 DIAGNOSIS — N400 Enlarged prostate without lower urinary tract symptoms: Secondary | ICD-10-CM | POA: Diagnosis not present

## 2023-01-16 DIAGNOSIS — R748 Abnormal levels of other serum enzymes: Secondary | ICD-10-CM | POA: Diagnosis not present

## 2023-01-16 DIAGNOSIS — E118 Type 2 diabetes mellitus with unspecified complications: Secondary | ICD-10-CM | POA: Diagnosis not present

## 2023-01-16 DIAGNOSIS — N529 Male erectile dysfunction, unspecified: Secondary | ICD-10-CM | POA: Diagnosis not present

## 2023-01-16 DIAGNOSIS — N184 Chronic kidney disease, stage 4 (severe): Secondary | ICD-10-CM | POA: Diagnosis not present

## 2023-01-18 ENCOUNTER — Other Ambulatory Visit (HOSPITAL_COMMUNITY): Payer: Self-pay

## 2023-01-28 DIAGNOSIS — E1165 Type 2 diabetes mellitus with hyperglycemia: Secondary | ICD-10-CM | POA: Diagnosis not present

## 2023-01-28 DIAGNOSIS — N184 Chronic kidney disease, stage 4 (severe): Secondary | ICD-10-CM | POA: Diagnosis not present

## 2023-01-29 ENCOUNTER — Other Ambulatory Visit (HOSPITAL_COMMUNITY): Payer: Self-pay

## 2023-01-31 ENCOUNTER — Other Ambulatory Visit (HOSPITAL_COMMUNITY): Payer: Self-pay

## 2023-02-01 DIAGNOSIS — R351 Nocturia: Secondary | ICD-10-CM | POA: Diagnosis not present

## 2023-02-01 DIAGNOSIS — N401 Enlarged prostate with lower urinary tract symptoms: Secondary | ICD-10-CM | POA: Diagnosis not present

## 2023-02-04 ENCOUNTER — Other Ambulatory Visit: Payer: Self-pay

## 2023-02-21 DIAGNOSIS — G4733 Obstructive sleep apnea (adult) (pediatric): Secondary | ICD-10-CM | POA: Diagnosis not present

## 2023-02-25 ENCOUNTER — Other Ambulatory Visit (HOSPITAL_COMMUNITY): Payer: Self-pay

## 2023-02-27 ENCOUNTER — Other Ambulatory Visit (HOSPITAL_COMMUNITY): Payer: Self-pay

## 2023-03-11 DIAGNOSIS — N184 Chronic kidney disease, stage 4 (severe): Secondary | ICD-10-CM | POA: Diagnosis not present

## 2023-03-21 DIAGNOSIS — H31001 Unspecified chorioretinal scars, right eye: Secondary | ICD-10-CM | POA: Diagnosis not present

## 2023-03-21 DIAGNOSIS — Z961 Presence of intraocular lens: Secondary | ICD-10-CM | POA: Diagnosis not present

## 2023-03-21 DIAGNOSIS — E119 Type 2 diabetes mellitus without complications: Secondary | ICD-10-CM | POA: Diagnosis not present

## 2023-03-21 DIAGNOSIS — H52203 Unspecified astigmatism, bilateral: Secondary | ICD-10-CM | POA: Diagnosis not present

## 2023-03-22 DIAGNOSIS — N400 Enlarged prostate without lower urinary tract symptoms: Secondary | ICD-10-CM | POA: Diagnosis not present

## 2023-03-22 DIAGNOSIS — N184 Chronic kidney disease, stage 4 (severe): Secondary | ICD-10-CM | POA: Diagnosis not present

## 2023-03-22 DIAGNOSIS — E875 Hyperkalemia: Secondary | ICD-10-CM | POA: Diagnosis not present

## 2023-03-22 DIAGNOSIS — E1122 Type 2 diabetes mellitus with diabetic chronic kidney disease: Secondary | ICD-10-CM | POA: Diagnosis not present

## 2023-03-22 DIAGNOSIS — N2581 Secondary hyperparathyroidism of renal origin: Secondary | ICD-10-CM | POA: Diagnosis not present

## 2023-03-22 DIAGNOSIS — D631 Anemia in chronic kidney disease: Secondary | ICD-10-CM | POA: Diagnosis not present

## 2023-03-25 ENCOUNTER — Other Ambulatory Visit (HOSPITAL_COMMUNITY): Payer: Self-pay

## 2023-03-25 ENCOUNTER — Other Ambulatory Visit: Payer: Self-pay

## 2023-03-26 ENCOUNTER — Other Ambulatory Visit (HOSPITAL_COMMUNITY): Payer: Self-pay

## 2023-03-27 ENCOUNTER — Other Ambulatory Visit (HOSPITAL_COMMUNITY): Payer: Self-pay

## 2023-04-18 ENCOUNTER — Other Ambulatory Visit (HOSPITAL_COMMUNITY): Payer: Self-pay

## 2023-04-19 ENCOUNTER — Other Ambulatory Visit (HOSPITAL_COMMUNITY): Payer: Self-pay

## 2023-04-19 MED ORDER — TRULICITY 3 MG/0.5ML ~~LOC~~ SOAJ
3.0000 mg | SUBCUTANEOUS | 0 refills | Status: DC
  Filled 2023-04-19: qty 2, 28d supply, fill #0

## 2023-05-07 DIAGNOSIS — N184 Chronic kidney disease, stage 4 (severe): Secondary | ICD-10-CM | POA: Diagnosis not present

## 2023-05-20 ENCOUNTER — Other Ambulatory Visit (HOSPITAL_COMMUNITY): Payer: Self-pay

## 2023-05-20 MED ORDER — TRULICITY 3 MG/0.5ML ~~LOC~~ SOAJ
3.0000 mg | SUBCUTANEOUS | 0 refills | Status: AC
  Filled 2023-05-20: qty 6, 84d supply, fill #0

## 2023-05-21 ENCOUNTER — Other Ambulatory Visit (HOSPITAL_COMMUNITY): Payer: Self-pay

## 2023-05-31 DIAGNOSIS — N184 Chronic kidney disease, stage 4 (severe): Secondary | ICD-10-CM | POA: Diagnosis not present

## 2023-05-31 DIAGNOSIS — G4733 Obstructive sleep apnea (adult) (pediatric): Secondary | ICD-10-CM | POA: Diagnosis not present

## 2023-05-31 DIAGNOSIS — E1165 Type 2 diabetes mellitus with hyperglycemia: Secondary | ICD-10-CM | POA: Diagnosis not present

## 2023-06-03 ENCOUNTER — Other Ambulatory Visit: Payer: Self-pay

## 2023-06-04 ENCOUNTER — Other Ambulatory Visit (HOSPITAL_COMMUNITY): Payer: Self-pay

## 2023-06-11 ENCOUNTER — Other Ambulatory Visit (HOSPITAL_COMMUNITY): Payer: Self-pay

## 2023-06-13 ENCOUNTER — Other Ambulatory Visit (HOSPITAL_COMMUNITY): Payer: Self-pay

## 2023-06-13 MED ORDER — FREESTYLE LIBRE 3 SENSOR MISC
5 refills | Status: DC
  Filled 2023-06-13: qty 2, 28d supply, fill #0
  Filled 2023-07-18: qty 2, 28d supply, fill #1
  Filled 2023-08-12: qty 2, 28d supply, fill #2
  Filled 2023-09-04: qty 2, 28d supply, fill #3
  Filled 2023-11-02: qty 2, 28d supply, fill #4
  Filled 2023-12-01: qty 2, 28d supply, fill #5

## 2023-06-17 ENCOUNTER — Other Ambulatory Visit (HOSPITAL_COMMUNITY): Payer: Self-pay

## 2023-06-19 DIAGNOSIS — D631 Anemia in chronic kidney disease: Secondary | ICD-10-CM | POA: Diagnosis not present

## 2023-06-19 DIAGNOSIS — E1122 Type 2 diabetes mellitus with diabetic chronic kidney disease: Secondary | ICD-10-CM | POA: Diagnosis not present

## 2023-06-19 DIAGNOSIS — N2581 Secondary hyperparathyroidism of renal origin: Secondary | ICD-10-CM | POA: Diagnosis not present

## 2023-06-19 DIAGNOSIS — N400 Enlarged prostate without lower urinary tract symptoms: Secondary | ICD-10-CM | POA: Diagnosis not present

## 2023-06-19 DIAGNOSIS — N184 Chronic kidney disease, stage 4 (severe): Secondary | ICD-10-CM | POA: Diagnosis not present

## 2023-06-19 DIAGNOSIS — E875 Hyperkalemia: Secondary | ICD-10-CM | POA: Diagnosis not present

## 2023-06-20 ENCOUNTER — Other Ambulatory Visit (HOSPITAL_COMMUNITY): Payer: Self-pay

## 2023-06-20 MED ORDER — LOKELMA 10 G PO PACK
10.0000 g | PACK | ORAL | 12 refills | Status: AC
  Filled 2023-06-20: qty 30, 70d supply, fill #0
  Filled 2023-09-23: qty 30, 70d supply, fill #1
  Filled 2023-12-16: qty 30, 70d supply, fill #2
  Filled 2024-03-25: qty 30, 70d supply, fill #3

## 2023-07-19 DIAGNOSIS — Z23 Encounter for immunization: Secondary | ICD-10-CM | POA: Diagnosis not present

## 2023-07-19 DIAGNOSIS — Z0000 Encounter for general adult medical examination without abnormal findings: Secondary | ICD-10-CM | POA: Diagnosis not present

## 2023-07-19 DIAGNOSIS — G4733 Obstructive sleep apnea (adult) (pediatric): Secondary | ICD-10-CM | POA: Diagnosis not present

## 2023-07-19 DIAGNOSIS — E1122 Type 2 diabetes mellitus with diabetic chronic kidney disease: Secondary | ICD-10-CM | POA: Diagnosis not present

## 2023-07-19 DIAGNOSIS — E782 Mixed hyperlipidemia: Secondary | ICD-10-CM | POA: Diagnosis not present

## 2023-07-19 DIAGNOSIS — N400 Enlarged prostate without lower urinary tract symptoms: Secondary | ICD-10-CM | POA: Diagnosis not present

## 2023-07-19 DIAGNOSIS — Z125 Encounter for screening for malignant neoplasm of prostate: Secondary | ICD-10-CM | POA: Diagnosis not present

## 2023-07-21 ENCOUNTER — Other Ambulatory Visit (HOSPITAL_COMMUNITY): Payer: Self-pay

## 2023-07-22 ENCOUNTER — Other Ambulatory Visit (HOSPITAL_COMMUNITY): Payer: Self-pay

## 2023-07-22 MED ORDER — AMOXICILLIN-POT CLAVULANATE 875-125 MG PO TABS
1.0000 | ORAL_TABLET | Freq: Two times a day (BID) | ORAL | 0 refills | Status: AC
  Filled 2023-07-22: qty 20, 10d supply, fill #0

## 2023-07-23 ENCOUNTER — Other Ambulatory Visit (HOSPITAL_COMMUNITY): Payer: Self-pay

## 2023-07-23 MED ORDER — AMOXICILLIN-POT CLAVULANATE 250-125 MG PO TABS
1.0000 | ORAL_TABLET | Freq: Two times a day (BID) | ORAL | 0 refills | Status: AC
  Filled 2023-07-23: qty 20, 10d supply, fill #0

## 2023-07-24 ENCOUNTER — Other Ambulatory Visit (HOSPITAL_COMMUNITY): Payer: Self-pay

## 2023-07-24 MED ORDER — TRULICITY 4.5 MG/0.5ML ~~LOC~~ SOAJ
4.5000 mg | SUBCUTANEOUS | 1 refills | Status: DC
  Filled 2023-07-24: qty 6, 84d supply, fill #0
  Filled 2023-08-12: qty 2, 28d supply, fill #0
  Filled 2023-09-04: qty 2, 28d supply, fill #1
  Filled 2023-10-07: qty 2, 28d supply, fill #2
  Filled 2023-11-02: qty 2, 28d supply, fill #3
  Filled 2023-12-01: qty 2, 28d supply, fill #4
  Filled 2023-12-29: qty 2, 28d supply, fill #5

## 2023-07-26 ENCOUNTER — Other Ambulatory Visit (HOSPITAL_COMMUNITY): Payer: Self-pay

## 2023-07-26 MED ORDER — FINASTERIDE 5 MG PO TABS
5.0000 mg | ORAL_TABLET | Freq: Every day | ORAL | 1 refills | Status: DC
  Filled 2023-07-26 (×2): qty 90, 90d supply, fill #0
  Filled 2023-11-02: qty 90, 90d supply, fill #1

## 2023-07-30 ENCOUNTER — Other Ambulatory Visit (HOSPITAL_COMMUNITY): Payer: Self-pay

## 2023-07-31 IMAGING — US US RENAL
1 series · 14 of 25 positions shown · non-contrast
Comparison: July 01, 2020.

CLINICAL DATA: Elevated serum creatinine level.

EXAM:
RENAL / URINARY TRACT ULTRASOUND COMPLETE

[Series 1: us renal · 0.25mm/px · 14 of 50 slices shown]
[im 1/50]
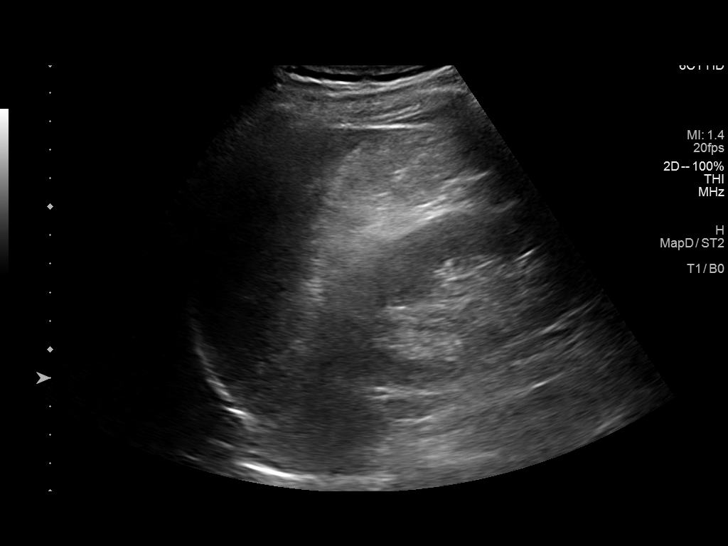
[im 5/50]
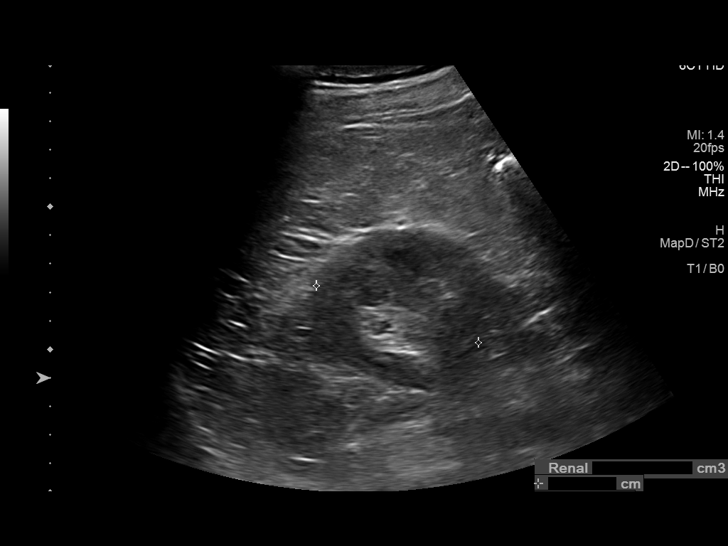
[im 9/50]
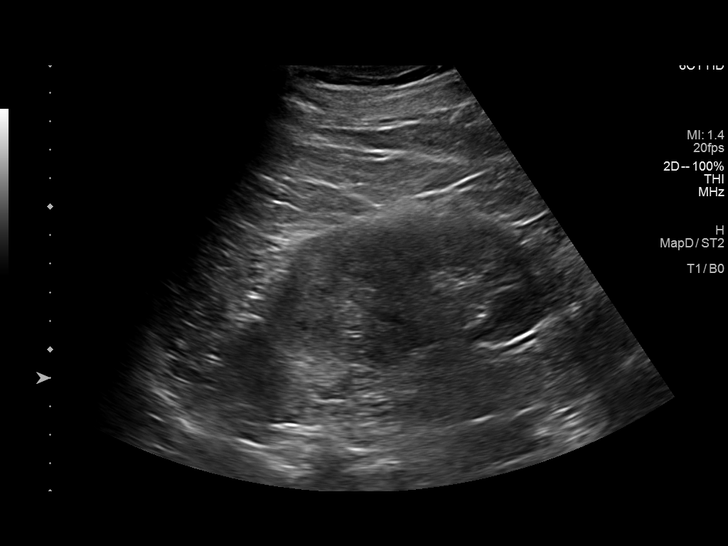
[im 13/50]
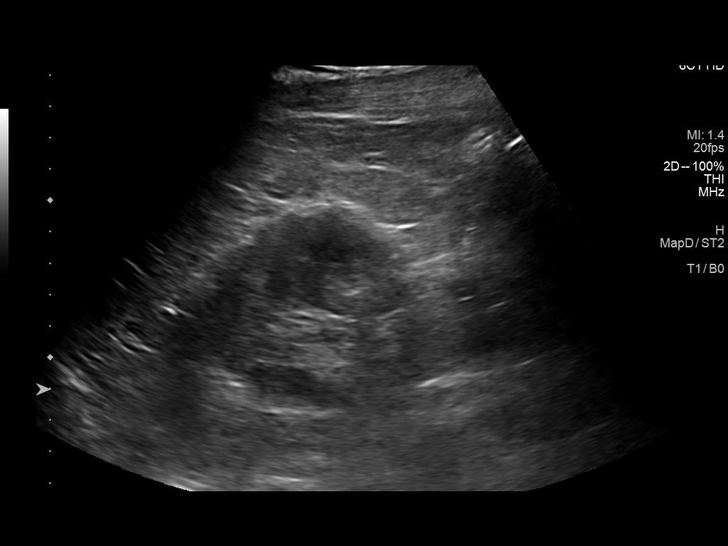
[im 17/50]
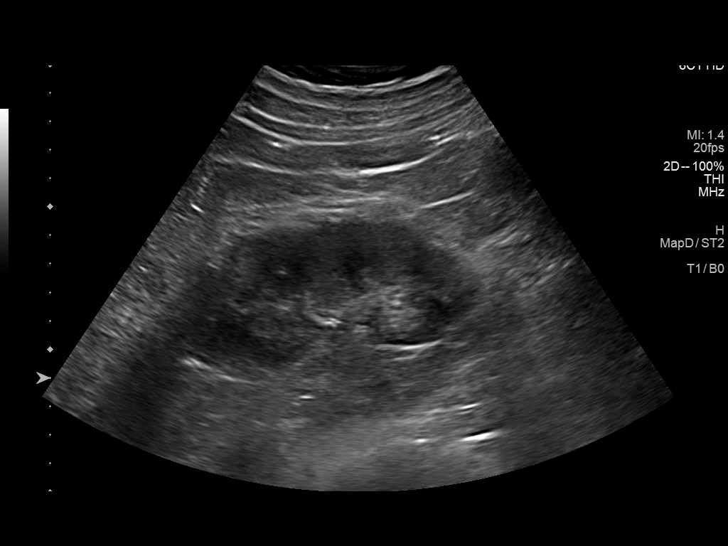
[im 19/50]
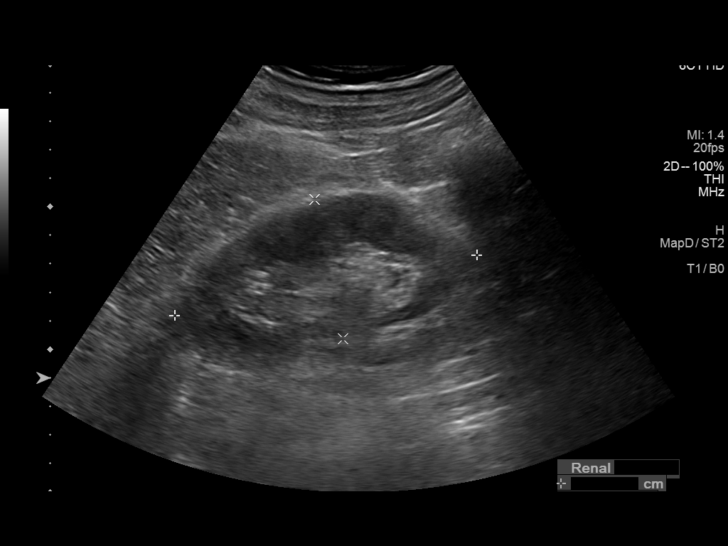
[im 23/50]
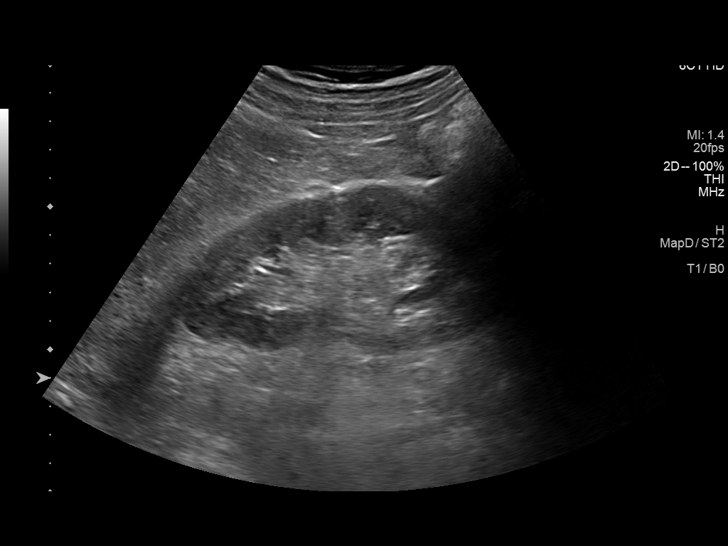
[im 27/50]
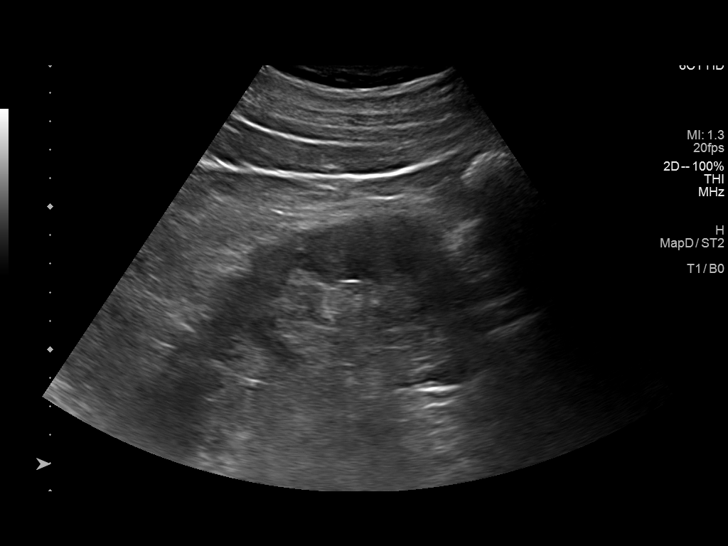
[im 31/50]
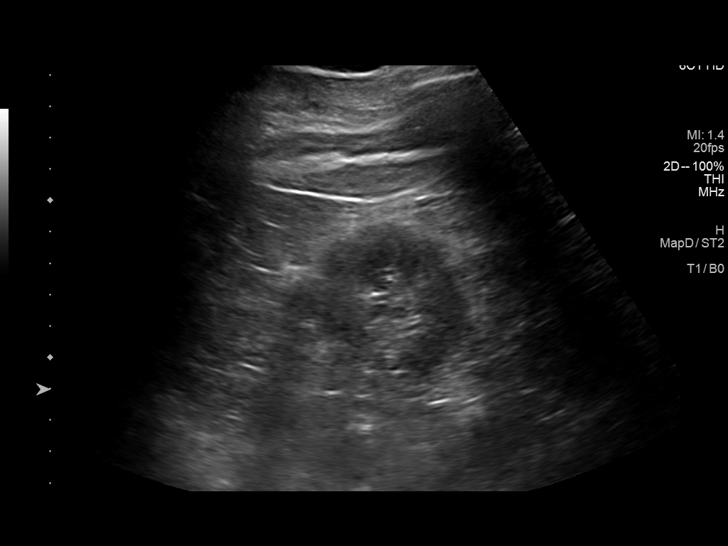
[im 33/50]
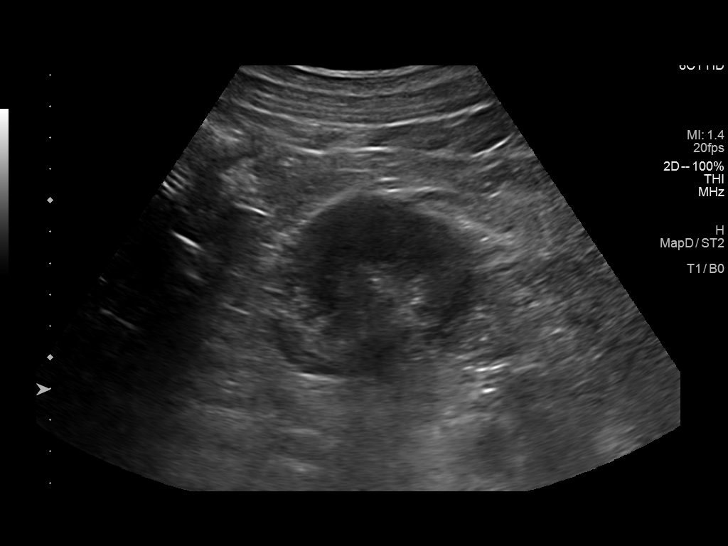
[im 37/50]
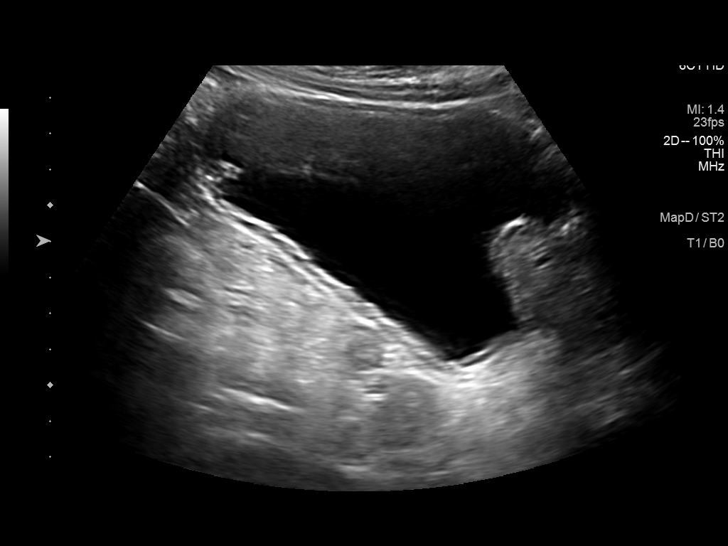
[im 41/50]
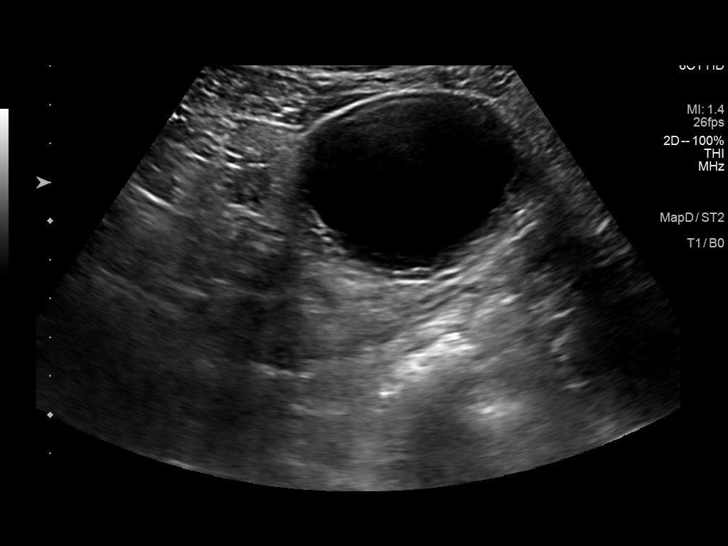
[im 45/50]
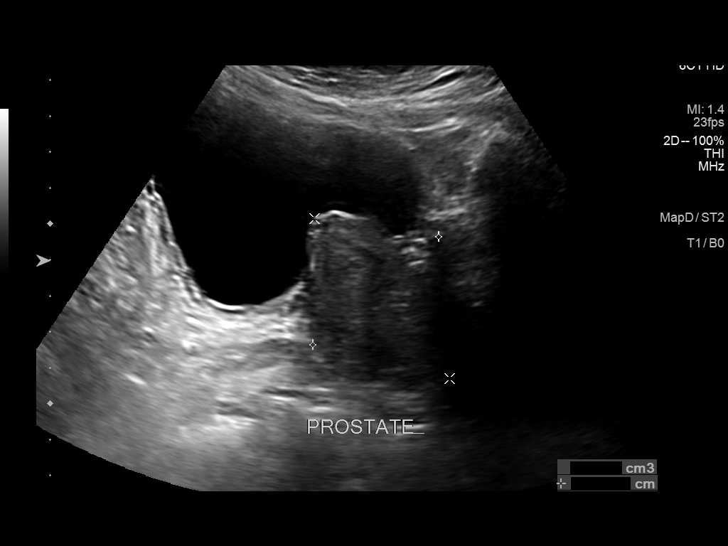
[im 50/50]
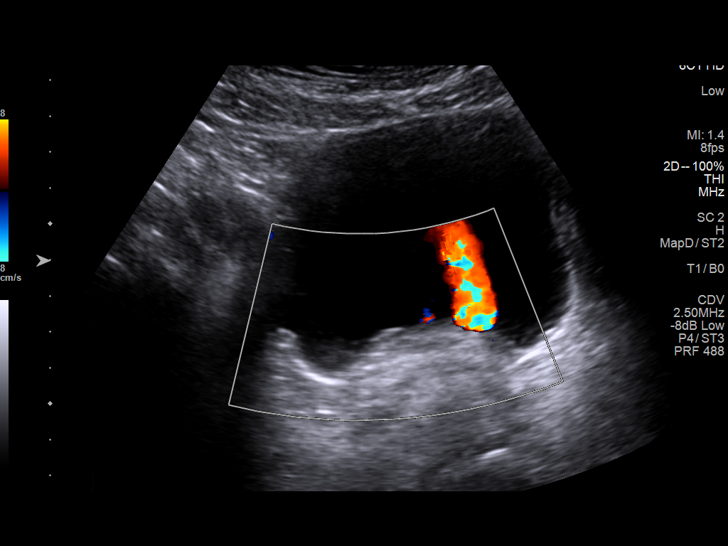

[14 of 25 positions shown; findings below may reference images not displayed]

FINDINGS: Right Kidney:

Renal measurements: 10.5 x 6.0 x 5.4 cm = volume: 195 mL.
Echogenicity within normal limits. No mass or hydronephrosis
visualized.

Left Kidney:

Renal measurements: 10.8 x 5.0 x 5.2 cm = volume: 146 mL.
Echogenicity within normal limits. No mass or hydronephrosis
visualized.

Bladder:

Moderately enlarged prostate gland is noted which extends into
inferior aspect of urinary bladder. Bladder is otherwise
unremarkable. Bilateral ureteral jets are noted.

Other:

None.
IMPRESSION: Kidneys are unremarkable.

Moderately enlarged prostate gland is noted which extends into
inferior aspect of urinary bladder as noted on prior exam.

## 2023-08-12 ENCOUNTER — Other Ambulatory Visit (HOSPITAL_COMMUNITY): Payer: Self-pay

## 2023-08-12 MED ORDER — GLIMEPIRIDE 2 MG PO TABS
2.0000 mg | ORAL_TABLET | Freq: Two times a day (BID) | ORAL | 1 refills | Status: AC
  Filled 2023-08-12: qty 180, 90d supply, fill #0
  Filled 2024-01-07: qty 180, 90d supply, fill #1

## 2023-08-13 ENCOUNTER — Other Ambulatory Visit (HOSPITAL_COMMUNITY): Payer: Self-pay

## 2023-08-14 DIAGNOSIS — Z23 Encounter for immunization: Secondary | ICD-10-CM | POA: Diagnosis not present

## 2023-08-16 ENCOUNTER — Other Ambulatory Visit (HOSPITAL_COMMUNITY): Payer: Self-pay

## 2023-09-04 ENCOUNTER — Other Ambulatory Visit (HOSPITAL_COMMUNITY): Payer: Self-pay

## 2023-09-05 ENCOUNTER — Other Ambulatory Visit (HOSPITAL_COMMUNITY): Payer: Self-pay

## 2023-09-13 DIAGNOSIS — N184 Chronic kidney disease, stage 4 (severe): Secondary | ICD-10-CM | POA: Diagnosis not present

## 2023-09-14 ENCOUNTER — Other Ambulatory Visit (HOSPITAL_COMMUNITY): Payer: Self-pay

## 2023-09-16 ENCOUNTER — Other Ambulatory Visit (HOSPITAL_COMMUNITY): Payer: Self-pay

## 2023-09-16 MED ORDER — IPRATROPIUM BROMIDE 0.06 % NA SOLN
2.0000 | Freq: Three times a day (TID) | NASAL | 11 refills | Status: AC | PRN
  Filled 2023-09-16: qty 45, 75d supply, fill #0
  Filled 2024-03-12: qty 45, 75d supply, fill #1
  Filled 2024-07-02: qty 45, 75d supply, fill #2

## 2023-09-17 ENCOUNTER — Other Ambulatory Visit (HOSPITAL_COMMUNITY): Payer: Self-pay

## 2023-09-20 ENCOUNTER — Other Ambulatory Visit (HOSPITAL_COMMUNITY): Payer: Self-pay

## 2023-09-23 ENCOUNTER — Other Ambulatory Visit: Payer: Self-pay

## 2023-09-23 ENCOUNTER — Other Ambulatory Visit (HOSPITAL_COMMUNITY): Payer: Self-pay

## 2023-09-24 ENCOUNTER — Other Ambulatory Visit (HOSPITAL_COMMUNITY): Payer: Self-pay

## 2023-09-24 DIAGNOSIS — N184 Chronic kidney disease, stage 4 (severe): Secondary | ICD-10-CM | POA: Diagnosis not present

## 2023-09-24 DIAGNOSIS — E875 Hyperkalemia: Secondary | ICD-10-CM | POA: Diagnosis not present

## 2023-09-24 DIAGNOSIS — E1122 Type 2 diabetes mellitus with diabetic chronic kidney disease: Secondary | ICD-10-CM | POA: Diagnosis not present

## 2023-09-24 DIAGNOSIS — N2581 Secondary hyperparathyroidism of renal origin: Secondary | ICD-10-CM | POA: Diagnosis not present

## 2023-09-24 DIAGNOSIS — D631 Anemia in chronic kidney disease: Secondary | ICD-10-CM | POA: Diagnosis not present

## 2023-09-24 DIAGNOSIS — N400 Enlarged prostate without lower urinary tract symptoms: Secondary | ICD-10-CM | POA: Diagnosis not present

## 2023-09-24 MED ORDER — SILDENAFIL CITRATE 100 MG PO TABS
100.0000 mg | ORAL_TABLET | Freq: Every day | ORAL | 1 refills | Status: DC | PRN
  Filled 2023-09-24: qty 10, 50d supply, fill #0
  Filled 2024-02-28: qty 10, 50d supply, fill #1

## 2023-09-25 ENCOUNTER — Other Ambulatory Visit (HOSPITAL_COMMUNITY): Payer: Self-pay

## 2023-10-01 ENCOUNTER — Other Ambulatory Visit (HOSPITAL_COMMUNITY): Payer: Self-pay

## 2023-10-01 DIAGNOSIS — D649 Anemia, unspecified: Secondary | ICD-10-CM | POA: Diagnosis not present

## 2023-10-01 DIAGNOSIS — E119 Type 2 diabetes mellitus without complications: Secondary | ICD-10-CM | POA: Diagnosis not present

## 2023-10-01 DIAGNOSIS — R635 Abnormal weight gain: Secondary | ICD-10-CM | POA: Diagnosis not present

## 2023-10-01 DIAGNOSIS — N184 Chronic kidney disease, stage 4 (severe): Secondary | ICD-10-CM | POA: Diagnosis not present

## 2023-10-01 MED ORDER — FUROSEMIDE 80 MG PO TABS
80.0000 mg | ORAL_TABLET | ORAL | 3 refills | Status: AC | PRN
  Filled 2023-10-01: qty 30, 30d supply, fill #0

## 2023-10-08 ENCOUNTER — Other Ambulatory Visit (HOSPITAL_COMMUNITY): Payer: Self-pay

## 2023-10-10 ENCOUNTER — Other Ambulatory Visit (HOSPITAL_COMMUNITY): Payer: Self-pay

## 2023-11-04 ENCOUNTER — Other Ambulatory Visit (HOSPITAL_COMMUNITY): Payer: Self-pay

## 2023-12-02 ENCOUNTER — Other Ambulatory Visit: Payer: Self-pay

## 2023-12-02 ENCOUNTER — Other Ambulatory Visit (HOSPITAL_COMMUNITY): Payer: Self-pay

## 2023-12-11 DIAGNOSIS — L821 Other seborrheic keratosis: Secondary | ICD-10-CM | POA: Diagnosis not present

## 2023-12-11 DIAGNOSIS — D2271 Melanocytic nevi of right lower limb, including hip: Secondary | ICD-10-CM | POA: Diagnosis not present

## 2023-12-11 DIAGNOSIS — L820 Inflamed seborrheic keratosis: Secondary | ICD-10-CM | POA: Diagnosis not present

## 2023-12-11 DIAGNOSIS — D2261 Melanocytic nevi of right upper limb, including shoulder: Secondary | ICD-10-CM | POA: Diagnosis not present

## 2023-12-11 DIAGNOSIS — D2262 Melanocytic nevi of left upper limb, including shoulder: Secondary | ICD-10-CM | POA: Diagnosis not present

## 2023-12-11 DIAGNOSIS — L570 Actinic keratosis: Secondary | ICD-10-CM | POA: Diagnosis not present

## 2023-12-11 DIAGNOSIS — D2272 Melanocytic nevi of left lower limb, including hip: Secondary | ICD-10-CM | POA: Diagnosis not present

## 2023-12-11 DIAGNOSIS — D225 Melanocytic nevi of trunk: Secondary | ICD-10-CM | POA: Diagnosis not present

## 2023-12-16 ENCOUNTER — Other Ambulatory Visit (HOSPITAL_COMMUNITY): Payer: Self-pay

## 2023-12-16 ENCOUNTER — Other Ambulatory Visit: Payer: Self-pay

## 2023-12-16 DIAGNOSIS — N184 Chronic kidney disease, stage 4 (severe): Secondary | ICD-10-CM | POA: Diagnosis not present

## 2023-12-23 DIAGNOSIS — N400 Enlarged prostate without lower urinary tract symptoms: Secondary | ICD-10-CM | POA: Diagnosis not present

## 2023-12-23 DIAGNOSIS — D631 Anemia in chronic kidney disease: Secondary | ICD-10-CM | POA: Diagnosis not present

## 2023-12-23 DIAGNOSIS — E875 Hyperkalemia: Secondary | ICD-10-CM | POA: Diagnosis not present

## 2023-12-23 DIAGNOSIS — N184 Chronic kidney disease, stage 4 (severe): Secondary | ICD-10-CM | POA: Diagnosis not present

## 2023-12-23 DIAGNOSIS — R140 Abdominal distension (gaseous): Secondary | ICD-10-CM | POA: Diagnosis not present

## 2023-12-23 DIAGNOSIS — N2581 Secondary hyperparathyroidism of renal origin: Secondary | ICD-10-CM | POA: Diagnosis not present

## 2023-12-23 DIAGNOSIS — E1122 Type 2 diabetes mellitus with diabetic chronic kidney disease: Secondary | ICD-10-CM | POA: Diagnosis not present

## 2023-12-29 ENCOUNTER — Other Ambulatory Visit (HOSPITAL_COMMUNITY): Payer: Self-pay

## 2023-12-30 ENCOUNTER — Other Ambulatory Visit: Payer: Self-pay

## 2023-12-30 ENCOUNTER — Other Ambulatory Visit (HOSPITAL_COMMUNITY): Payer: Self-pay

## 2023-12-30 MED ORDER — FREESTYLE LIBRE 3 PLUS SENSOR MISC
5 refills | Status: AC
  Filled 2023-12-30: qty 2, 30d supply, fill #0
  Filled 2024-01-30: qty 2, 30d supply, fill #1
  Filled 2024-03-09: qty 2, 30d supply, fill #2
  Filled 2024-04-07: qty 2, 30d supply, fill #3
  Filled 2024-04-30 – 2024-05-04 (×2): qty 2, 30d supply, fill #4
  Filled 2024-06-09 – 2024-06-19 (×7): qty 2, 30d supply, fill #5
  Filled 2024-08-20: qty 2, 30d supply, fill #0
  Filled 2024-08-20: qty 2, 30d supply, fill #5
  Filled 2024-08-20 (×3): qty 2, 30d supply, fill #0

## 2023-12-31 DIAGNOSIS — Z01818 Encounter for other preprocedural examination: Secondary | ICD-10-CM | POA: Diagnosis not present

## 2023-12-31 DIAGNOSIS — N184 Chronic kidney disease, stage 4 (severe): Secondary | ICD-10-CM | POA: Diagnosis not present

## 2024-01-07 ENCOUNTER — Other Ambulatory Visit (HOSPITAL_COMMUNITY): Payer: Self-pay

## 2024-01-07 ENCOUNTER — Other Ambulatory Visit: Payer: Self-pay

## 2024-01-07 MED ORDER — TAMSULOSIN HCL 0.4 MG PO CAPS
0.4000 mg | ORAL_CAPSULE | Freq: Every day | ORAL | 3 refills | Status: AC
  Filled 2024-01-07: qty 90, 90d supply, fill #0
  Filled 2024-04-29: qty 90, 90d supply, fill #1
  Filled 2024-08-22: qty 90, 90d supply, fill #2

## 2024-01-08 ENCOUNTER — Other Ambulatory Visit (HOSPITAL_COMMUNITY): Payer: Self-pay

## 2024-01-08 MED ORDER — FINASTERIDE 5 MG PO TABS
5.0000 mg | ORAL_TABLET | Freq: Every day | ORAL | 1 refills | Status: AC
  Filled 2024-01-08 – 2024-01-13 (×2): qty 90, 90d supply, fill #0
  Filled 2024-02-04: qty 90, 90d supply, fill #1

## 2024-01-13 ENCOUNTER — Other Ambulatory Visit (HOSPITAL_COMMUNITY): Payer: Self-pay

## 2024-01-30 ENCOUNTER — Other Ambulatory Visit (HOSPITAL_COMMUNITY): Payer: Self-pay

## 2024-01-30 ENCOUNTER — Other Ambulatory Visit: Payer: Self-pay

## 2024-01-30 DIAGNOSIS — D649 Anemia, unspecified: Secondary | ICD-10-CM | POA: Diagnosis not present

## 2024-01-30 DIAGNOSIS — E1165 Type 2 diabetes mellitus with hyperglycemia: Secondary | ICD-10-CM | POA: Diagnosis not present

## 2024-01-30 DIAGNOSIS — N184 Chronic kidney disease, stage 4 (severe): Secondary | ICD-10-CM | POA: Diagnosis not present

## 2024-01-30 MED ORDER — TRULICITY 4.5 MG/0.5ML ~~LOC~~ SOAJ
4.5000 mg | SUBCUTANEOUS | 1 refills | Status: AC
  Filled 2024-01-30: qty 6, 84d supply, fill #0

## 2024-01-30 MED ORDER — INSULIN DEGLUDEC 100 UNIT/ML ~~LOC~~ SOPN
30.0000 [IU] | PEN_INJECTOR | Freq: Every day | SUBCUTANEOUS | 3 refills | Status: AC
  Filled 2024-01-30: qty 27, 90d supply, fill #0
  Filled 2024-05-04: qty 27, 90d supply, fill #1
  Filled 2024-10-28: qty 27, 90d supply, fill #2

## 2024-01-30 MED ORDER — INSULIN PEN NEEDLE 32G X 4 MM MISC
1.0000 | Freq: Every day | 3 refills | Status: AC
  Filled 2024-01-30: qty 100, 90d supply, fill #0
  Filled 2024-04-29: qty 100, 90d supply, fill #1

## 2024-02-04 ENCOUNTER — Other Ambulatory Visit (HOSPITAL_COMMUNITY): Payer: Self-pay

## 2024-02-26 DIAGNOSIS — N401 Enlarged prostate with lower urinary tract symptoms: Secondary | ICD-10-CM | POA: Diagnosis not present

## 2024-02-26 DIAGNOSIS — R351 Nocturia: Secondary | ICD-10-CM | POA: Diagnosis not present

## 2024-02-28 ENCOUNTER — Other Ambulatory Visit (HOSPITAL_COMMUNITY): Payer: Self-pay

## 2024-02-28 MED ORDER — OZEMPIC (0.25 OR 0.5 MG/DOSE) 2 MG/3ML ~~LOC~~ SOPN
PEN_INJECTOR | SUBCUTANEOUS | 6 refills | Status: AC
  Filled 2024-02-28: qty 3, 28d supply, fill #0
  Filled 2024-03-25: qty 3, 28d supply, fill #1
  Filled 2024-04-29: qty 3, 28d supply, fill #2
  Filled 2024-06-19: qty 3, 28d supply, fill #3
  Filled 2024-08-19: qty 3, 28d supply, fill #4
  Filled 2024-09-15: qty 3, 28d supply, fill #5
  Filled 2024-10-28: qty 3, 28d supply, fill #6

## 2024-03-04 DIAGNOSIS — I129 Hypertensive chronic kidney disease with stage 1 through stage 4 chronic kidney disease, or unspecified chronic kidney disease: Secondary | ICD-10-CM | POA: Diagnosis not present

## 2024-03-04 DIAGNOSIS — G4730 Sleep apnea, unspecified: Secondary | ICD-10-CM | POA: Diagnosis not present

## 2024-03-04 DIAGNOSIS — E1122 Type 2 diabetes mellitus with diabetic chronic kidney disease: Secondary | ICD-10-CM | POA: Diagnosis not present

## 2024-03-04 DIAGNOSIS — Z01818 Encounter for other preprocedural examination: Secondary | ICD-10-CM | POA: Diagnosis not present

## 2024-03-04 DIAGNOSIS — N400 Enlarged prostate without lower urinary tract symptoms: Secondary | ICD-10-CM | POA: Diagnosis not present

## 2024-03-04 DIAGNOSIS — N184 Chronic kidney disease, stage 4 (severe): Secondary | ICD-10-CM | POA: Diagnosis not present

## 2024-03-04 DIAGNOSIS — I10 Essential (primary) hypertension: Secondary | ICD-10-CM | POA: Diagnosis not present

## 2024-03-04 DIAGNOSIS — Z01810 Encounter for preprocedural cardiovascular examination: Secondary | ICD-10-CM | POA: Diagnosis not present

## 2024-03-04 NOTE — Progress Notes (Signed)
 03/04/24 1500  Weakness (Grip Strength)  Weakeness: Grip Strength 1 30.9  Weakeness: Grip Strength 2 30.1  Weakeness: Grip Strength 3 32  Weakness (Grip Strength) Score 31  Sex Adjusted Average -0.32  Manual Grip Score 1  Chair Stands  Chair Stand 1 4 (5.72 sec)  Manual Total Chair Stand Score 4  Balance Testing  Balance Testing Tandem 10  Balance Testing Semi Tandem 10  Balance Testing Side by Side 10  Manual Total Balance Testing Score 4  15 Foot Walk  15 foot walk 1 4 (2.25 sec)  15 foot walk 1 4 (2.34 sec)  Average Score 4  Manual Total 15 Foot Walk Testing Score 4  OTHER  Total Fitness Score 13   FIT TESTING Evaluation type:   Maintenance  GENERAL HEALTH:  In general, would you say your health is (circle one):  Excellent  Very Good   Good   Fair   Poor   Compared to one year ago, how would you rate your health in general now?  [] Much better now than one year ago  [] Somewhat better now than one year ago  [x] About the same  [] Somewhat worse now than one year ago  [] Much worse than one year ago      LIMITATIONS OF ACTIVITIES:    Vigorous activities, such as running, lifting heavy objects, participating in strenuous sports. [] Yes, Limited a lot  [] Yes, Limited a Little  [x] No, Not Limited at all  Moderate activities, such as moving a table, pushing a vacuum cleaner, bowling, or playing golf [] Yes, Limited a lot  [] Yes, Limited a Little  [x] No, Not Limited at all  Lifting or carrying groceries [] Yes, Limited a lot  [] Yes, Limited a Little  [x] No, Not Limited at all  Climbing several flights of stairs [] Yes, Limited a lot  [] Yes, Limited a Little  [x] No, Not Limited at all  Climbing one flight of stairs [] Yes, Limited a lot  [] Yes, Limited a Little  [x] No, Not Limited at all  Bending, kneeling, or stooping [] Yes, Limited a lot  [] Yes, Limited a Little  [x] No, Not Limited at all  Walking more than a mile [] Yes, Limited a lot  [] Yes, Limited a Little   [x] No, Not Limited at all  Walking several blocks [] Yes, Limited a lot  [] Yes, Limited a Little  [x] No, Not Limited at all  Walking one block [] Yes, Limited a lot  [] Yes, Limited a Little  [x] No, Not Limited at all  Bathing or dressing yourself [] Yes, Limited a lot  [] Yes, Limited a Little  [x] No, Not Limited at all   2 pts yes, limited a lot; 1 pt yes, limited a little; 0 pts No, not limited at all   SELF REPORT LIMITATIONS OF ACTIVITIES SCORE: 0   <8 FIT >/=8-14 intermediate  15-20 Not fit  Perform short physical performance testing in the following conditions (charted below if applicable):  Limitations score >/= 8 Age >/= 31 Assistive device present during exam Appearance of frailty despite above criteria   Do you use oxygen at home or at dialysis? Yes []  No[x]   Do you ever have trouble with blood pressure that is too low? Yes[]  No[x]   Do you own any of the following (check all that apply) [] Cane  [] Walker [] Wheelchair [] Motorized scooter [x] None of the above  How often do you use the motorized carts available to pick up your groceries? [x] Never [] Sometimes [] Every trip [] I am unable to shop alone  Have you lost more than  10 lbs without trying to in the last 12 months?  If so, how much? [] Yes, ____lbs   [x] No/I was trying to lose weight  Do you need any assistance performing any of the following activities of daily living? [] Bathing yourself [] Dressing yourself [] Feeding yourself [] Going to the restroom [x] None of the above  Do you use a pillbox? Yes[x]  No[]   Who fills your pillbox? [x] I fill it myself [] Someone that lives in my house fills it [] Someone that does not live with me fills it [] No pillbox  Have you had any falls? [] Once in the last 6 months [] More than once in the last 6 months [] Once in the last 12 months [] More than once in the last 12 months [x]  Never/None in the last year

## 2024-03-10 DIAGNOSIS — N184 Chronic kidney disease, stage 4 (severe): Secondary | ICD-10-CM | POA: Diagnosis not present

## 2024-03-11 NOTE — Telephone Encounter (Signed)
 Called pt to confirm date of testing.

## 2024-03-12 ENCOUNTER — Other Ambulatory Visit (HOSPITAL_COMMUNITY): Payer: Self-pay

## 2024-03-12 ENCOUNTER — Other Ambulatory Visit: Payer: Self-pay

## 2024-03-20 DIAGNOSIS — D631 Anemia in chronic kidney disease: Secondary | ICD-10-CM | POA: Diagnosis not present

## 2024-03-20 DIAGNOSIS — E1122 Type 2 diabetes mellitus with diabetic chronic kidney disease: Secondary | ICD-10-CM | POA: Diagnosis not present

## 2024-03-20 DIAGNOSIS — N2581 Secondary hyperparathyroidism of renal origin: Secondary | ICD-10-CM | POA: Diagnosis not present

## 2024-03-20 DIAGNOSIS — N400 Enlarged prostate without lower urinary tract symptoms: Secondary | ICD-10-CM | POA: Diagnosis not present

## 2024-03-20 DIAGNOSIS — E875 Hyperkalemia: Secondary | ICD-10-CM | POA: Diagnosis not present

## 2024-03-20 DIAGNOSIS — N184 Chronic kidney disease, stage 4 (severe): Secondary | ICD-10-CM | POA: Diagnosis not present

## 2024-03-23 DIAGNOSIS — H52203 Unspecified astigmatism, bilateral: Secondary | ICD-10-CM | POA: Diagnosis not present

## 2024-03-25 ENCOUNTER — Other Ambulatory Visit (HOSPITAL_COMMUNITY): Payer: Self-pay

## 2024-03-26 ENCOUNTER — Other Ambulatory Visit (HOSPITAL_COMMUNITY): Payer: Self-pay

## 2024-04-07 ENCOUNTER — Other Ambulatory Visit (HOSPITAL_COMMUNITY): Payer: Self-pay

## 2024-04-08 ENCOUNTER — Other Ambulatory Visit (HOSPITAL_COMMUNITY): Payer: Self-pay

## 2024-04-09 ENCOUNTER — Other Ambulatory Visit (HOSPITAL_COMMUNITY): Payer: Self-pay

## 2024-04-09 MED ORDER — SILDENAFIL CITRATE 100 MG PO TABS
100.0000 mg | ORAL_TABLET | Freq: Every day | ORAL | 1 refills | Status: AC | PRN
  Filled 2024-05-21: qty 10, 15d supply, fill #0

## 2024-04-15 DIAGNOSIS — Z01818 Encounter for other preprocedural examination: Secondary | ICD-10-CM | POA: Diagnosis not present

## 2024-04-15 DIAGNOSIS — N400 Enlarged prostate without lower urinary tract symptoms: Secondary | ICD-10-CM | POA: Diagnosis not present

## 2024-04-15 DIAGNOSIS — Z01810 Encounter for preprocedural cardiovascular examination: Secondary | ICD-10-CM | POA: Diagnosis not present

## 2024-04-15 DIAGNOSIS — G4730 Sleep apnea, unspecified: Secondary | ICD-10-CM | POA: Diagnosis not present

## 2024-04-15 DIAGNOSIS — N184 Chronic kidney disease, stage 4 (severe): Secondary | ICD-10-CM | POA: Diagnosis not present

## 2024-04-15 DIAGNOSIS — I129 Hypertensive chronic kidney disease with stage 1 through stage 4 chronic kidney disease, or unspecified chronic kidney disease: Secondary | ICD-10-CM | POA: Diagnosis not present

## 2024-04-15 DIAGNOSIS — E1122 Type 2 diabetes mellitus with diabetic chronic kidney disease: Secondary | ICD-10-CM | POA: Diagnosis not present

## 2024-04-15 DIAGNOSIS — I119 Hypertensive heart disease without heart failure: Secondary | ICD-10-CM | POA: Diagnosis not present

## 2024-04-15 DIAGNOSIS — I10 Essential (primary) hypertension: Secondary | ICD-10-CM | POA: Diagnosis not present

## 2024-04-29 ENCOUNTER — Other Ambulatory Visit (HOSPITAL_COMMUNITY): Payer: Self-pay

## 2024-04-30 ENCOUNTER — Other Ambulatory Visit (HOSPITAL_COMMUNITY): Payer: Self-pay

## 2024-05-04 ENCOUNTER — Other Ambulatory Visit (HOSPITAL_COMMUNITY): Payer: Self-pay

## 2024-05-05 ENCOUNTER — Other Ambulatory Visit (HOSPITAL_COMMUNITY): Payer: Self-pay

## 2024-05-19 ENCOUNTER — Other Ambulatory Visit (HOSPITAL_COMMUNITY): Payer: Self-pay

## 2024-05-19 DIAGNOSIS — N184 Chronic kidney disease, stage 4 (severe): Secondary | ICD-10-CM | POA: Diagnosis not present

## 2024-05-19 DIAGNOSIS — E1165 Type 2 diabetes mellitus with hyperglycemia: Secondary | ICD-10-CM | POA: Diagnosis not present

## 2024-05-19 DIAGNOSIS — D649 Anemia, unspecified: Secondary | ICD-10-CM | POA: Diagnosis not present

## 2024-05-19 DIAGNOSIS — G4733 Obstructive sleep apnea (adult) (pediatric): Secondary | ICD-10-CM | POA: Diagnosis not present

## 2024-05-19 MED ORDER — NOVOLOG FLEXPEN 100 UNIT/ML ~~LOC~~ SOPN
25.0000 [IU] | PEN_INJECTOR | Freq: Three times a day (TID) | SUBCUTANEOUS | 3 refills | Status: AC
  Filled 2024-05-19: qty 24, 32d supply, fill #0
  Filled 2024-05-22: qty 45, 90d supply, fill #0
  Filled ????-??-??: fill #0

## 2024-05-19 MED ORDER — INSULIN PEN NEEDLE 32G X 4 MM MISC
3 refills | Status: AC
  Filled 2024-05-19: qty 400, 90d supply, fill #0
  Filled 2024-05-22: qty 300, 75d supply, fill #0
  Filled 2024-05-22: qty 400, 90d supply, fill #0

## 2024-05-21 ENCOUNTER — Other Ambulatory Visit (HOSPITAL_COMMUNITY): Payer: Self-pay

## 2024-05-21 ENCOUNTER — Other Ambulatory Visit: Payer: Self-pay

## 2024-05-22 ENCOUNTER — Other Ambulatory Visit (HOSPITAL_COMMUNITY): Payer: Self-pay

## 2024-05-22 ENCOUNTER — Other Ambulatory Visit: Payer: Self-pay

## 2024-06-09 ENCOUNTER — Other Ambulatory Visit (HOSPITAL_BASED_OUTPATIENT_CLINIC_OR_DEPARTMENT_OTHER): Payer: Self-pay

## 2024-06-09 ENCOUNTER — Other Ambulatory Visit (HOSPITAL_COMMUNITY): Payer: Self-pay

## 2024-06-10 ENCOUNTER — Other Ambulatory Visit: Payer: Self-pay

## 2024-06-10 ENCOUNTER — Other Ambulatory Visit (HOSPITAL_COMMUNITY): Payer: Self-pay

## 2024-06-12 ENCOUNTER — Other Ambulatory Visit (HOSPITAL_COMMUNITY): Payer: Self-pay

## 2024-06-12 MED ORDER — VELTASSA 8.4 G PO PACK
1.0000 | PACK | ORAL | 3 refills | Status: AC
  Filled 2024-06-12 – 2024-07-03 (×3): qty 36, 84d supply, fill #0

## 2024-06-15 ENCOUNTER — Other Ambulatory Visit (HOSPITAL_COMMUNITY): Payer: Self-pay

## 2024-06-15 MED ORDER — LOKELMA 10 G PO PACK
10.0000 g | PACK | ORAL | 12 refills | Status: AC
  Filled 2024-06-15 – 2024-07-03 (×2): qty 30, 70d supply, fill #0
  Filled 2024-07-03: qty 12, 28d supply, fill #0
  Filled 2024-10-28: qty 30, 70d supply, fill #1

## 2024-06-16 ENCOUNTER — Other Ambulatory Visit (HOSPITAL_COMMUNITY): Payer: Self-pay

## 2024-06-19 ENCOUNTER — Other Ambulatory Visit (HOSPITAL_COMMUNITY): Payer: Self-pay

## 2024-06-24 ENCOUNTER — Other Ambulatory Visit (HOSPITAL_COMMUNITY): Payer: Self-pay

## 2024-07-03 ENCOUNTER — Other Ambulatory Visit (HOSPITAL_COMMUNITY): Payer: Self-pay

## 2024-07-07 ENCOUNTER — Other Ambulatory Visit (HOSPITAL_COMMUNITY): Payer: Self-pay

## 2024-07-31 ENCOUNTER — Other Ambulatory Visit (HOSPITAL_COMMUNITY): Payer: Self-pay

## 2024-08-18 ENCOUNTER — Other Ambulatory Visit (HOSPITAL_COMMUNITY): Payer: Self-pay

## 2024-08-19 ENCOUNTER — Other Ambulatory Visit (HOSPITAL_COMMUNITY): Payer: Self-pay

## 2024-08-20 ENCOUNTER — Other Ambulatory Visit: Payer: Self-pay

## 2024-08-20 ENCOUNTER — Other Ambulatory Visit (HOSPITAL_COMMUNITY): Payer: Self-pay

## 2024-08-24 ENCOUNTER — Other Ambulatory Visit (HOSPITAL_COMMUNITY): Payer: Self-pay

## 2024-08-28 ENCOUNTER — Other Ambulatory Visit (HOSPITAL_COMMUNITY): Payer: Self-pay

## 2024-09-14 ENCOUNTER — Other Ambulatory Visit (HOSPITAL_COMMUNITY): Payer: Self-pay

## 2024-09-14 MED ORDER — FUROSEMIDE 40 MG PO TABS
40.0000 mg | ORAL_TABLET | Freq: Every day | ORAL | 3 refills | Status: AC | PRN
  Filled 2024-09-14: qty 60, 60d supply, fill #0

## 2024-09-15 ENCOUNTER — Other Ambulatory Visit (HOSPITAL_COMMUNITY): Payer: Self-pay

## 2024-10-01 ENCOUNTER — Other Ambulatory Visit (HOSPITAL_COMMUNITY): Payer: Self-pay

## 2024-10-01 MED ORDER — FINASTERIDE 5 MG PO TABS
5.0000 mg | ORAL_TABLET | Freq: Every day | ORAL | 1 refills | Status: AC
  Filled 2024-10-01: qty 90, 90d supply, fill #0

## 2024-10-05 ENCOUNTER — Other Ambulatory Visit (HOSPITAL_COMMUNITY): Payer: Self-pay

## 2024-10-08 ENCOUNTER — Other Ambulatory Visit (HOSPITAL_COMMUNITY): Payer: Self-pay

## 2024-10-28 ENCOUNTER — Other Ambulatory Visit (HOSPITAL_COMMUNITY): Payer: Self-pay

## 2024-10-29 ENCOUNTER — Other Ambulatory Visit (HOSPITAL_COMMUNITY): Payer: Self-pay

## 2024-11-02 ENCOUNTER — Other Ambulatory Visit (HOSPITAL_COMMUNITY): Payer: Self-pay

## 2024-11-04 ENCOUNTER — Other Ambulatory Visit (HOSPITAL_COMMUNITY): Payer: Self-pay

## 2024-11-04 MED ORDER — IPRATROPIUM BROMIDE 0.06 % NA SOLN
2.0000 | Freq: Three times a day (TID) | NASAL | 11 refills | Status: AC
  Filled 2024-11-04: qty 45, 75d supply, fill #0

## 2024-11-06 ENCOUNTER — Other Ambulatory Visit (HOSPITAL_COMMUNITY): Payer: Self-pay
